# Patient Record
Sex: Female | Born: 1980 | Race: White | Hispanic: No | Marital: Single | Smoking: Never smoker
Health system: Southern US, Community
[De-identification: ages and names within clinical notes are randomized; demographics above are authoritative.]

## PROBLEM LIST (undated history)

## (undated) DIAGNOSIS — J45909 Unspecified asthma, uncomplicated: Secondary | ICD-10-CM

## (undated) DIAGNOSIS — R062 Wheezing: Secondary | ICD-10-CM

## (undated) DIAGNOSIS — K589 Irritable bowel syndrome without diarrhea: Secondary | ICD-10-CM

## (undated) DIAGNOSIS — N83209 Unspecified ovarian cyst, unspecified side: Secondary | ICD-10-CM

## (undated) DIAGNOSIS — G43111 Migraine with aura, intractable, with status migrainosus: Secondary | ICD-10-CM

## (undated) DIAGNOSIS — F99 Mental disorder, not otherwise specified: Secondary | ICD-10-CM

## (undated) DIAGNOSIS — K5792 Diverticulitis of intestine, part unspecified, without perforation or abscess without bleeding: Secondary | ICD-10-CM

## (undated) DIAGNOSIS — K8020 Calculus of gallbladder without cholecystitis without obstruction: Secondary | ICD-10-CM

## (undated) DIAGNOSIS — G44009 Cluster headache syndrome, unspecified, not intractable: Secondary | ICD-10-CM

## (undated) DIAGNOSIS — N390 Urinary tract infection, site not specified: Secondary | ICD-10-CM

## (undated) DIAGNOSIS — R51 Headache: Secondary | ICD-10-CM

## (undated) DIAGNOSIS — R519 Headache, unspecified: Secondary | ICD-10-CM

## (undated) DIAGNOSIS — J3489 Other specified disorders of nose and nasal sinuses: Secondary | ICD-10-CM

## (undated) HISTORY — DX: Unspecified asthma, uncomplicated: J45.909

## (undated) HISTORY — PX: WISDOM TOOTH EXTRACTION: SHX21

## (undated) HISTORY — DX: Irritable bowel syndrome, unspecified: K58.9

## (undated) HISTORY — DX: Mental disorder, not otherwise specified: F99

## (undated) HISTORY — DX: Migraine with aura, intractable, with status migrainosus: G43.111

## (undated) HISTORY — DX: Diverticulitis of intestine, part unspecified, without perforation or abscess without bleeding: K57.92

## (undated) HISTORY — PX: TONSILLECTOMY: SUR1361

---
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
## 2009-12-14 ENCOUNTER — Emergency Department (HOSPITAL_COMMUNITY): Admission: EM | Admit: 2009-12-14 | Discharge: 2009-12-14 | Payer: Self-pay | Admitting: Emergency Medicine

## 2010-08-18 ENCOUNTER — Emergency Department (HOSPITAL_COMMUNITY): Admission: EM | Admit: 2010-08-18 | Discharge: 2010-08-19 | Payer: Self-pay | Admitting: Emergency Medicine

## 2011-02-13 LAB — CBC
HCT: 41.1 % (ref 36.0–46.0)
Hemoglobin: 14 g/dL (ref 12.0–15.0)
MCH: 30.1 pg (ref 26.0–34.0)
MCHC: 34 g/dL (ref 30.0–36.0)
MCV: 88.7 fL (ref 78.0–100.0)
Platelets: 299 K/uL (ref 150–400)
RBC: 4.64 MIL/uL (ref 3.87–5.11)
RDW: 14.4 % (ref 11.5–15.5)
WBC: 12.8 K/uL — ABNORMAL HIGH (ref 4.0–10.5)

## 2011-02-13 LAB — COMPREHENSIVE METABOLIC PANEL WITH GFR
ALT: 30 U/L (ref 0–35)
AST: 27 U/L (ref 0–37)
Albumin: 3.9 g/dL (ref 3.5–5.2)
Alkaline Phosphatase: 76 U/L (ref 39–117)
BUN: 10 mg/dL (ref 6–23)
CO2: 24 meq/L (ref 19–32)
Calcium: 8.7 mg/dL (ref 8.4–10.5)
Chloride: 101 meq/L (ref 96–112)
Creatinine, Ser: 0.7 mg/dL (ref 0.4–1.2)
GFR calc non Af Amer: >60 mL/min
Glucose, Bld: 90 mg/dL (ref 70–99)
Potassium: 3.3 meq/L — ABNORMAL LOW (ref 3.5–5.1)
Sodium: 135 meq/L (ref 135–145)
Total Bilirubin: 0.4 mg/dL (ref 0.3–1.2)
Total Protein: 8.3 g/dL (ref 6.0–8.3)

## 2011-02-13 LAB — URINALYSIS, ROUTINE W REFLEX MICROSCOPIC
Glucose, UA: NEGATIVE mg/dL
Leukocytes, UA: NEGATIVE
Nitrite: NEGATIVE
Protein, ur: NEGATIVE mg/dL
Specific Gravity, Urine: >1.03 — ABNORMAL HIGH (ref 1.005–1.030)
Urobilinogen, UA: 0.2 mg/dL (ref 0.0–1.0)
pH: 6 (ref 5.0–8.0)

## 2011-02-13 LAB — DIFFERENTIAL
Basophils Absolute: 0 K/uL (ref 0.0–0.1)
Basophils Relative: 0 % (ref 0–1)
Eosinophils Absolute: 0 K/uL (ref 0.0–0.7)
Eosinophils Relative: 0 % (ref 0–5)
Lymphocytes Relative: 9 % — ABNORMAL LOW (ref 12–46)
Lymphs Abs: 1.1 K/uL (ref 0.7–4.0)
Monocytes Absolute: 0.7 K/uL (ref 0.1–1.0)
Monocytes Relative: 5 % (ref 3–12)
Neutro Abs: 10.9 K/uL — ABNORMAL HIGH (ref 1.7–7.7)
Neutrophils Relative %: 86 % — ABNORMAL HIGH (ref 43–77)

## 2011-02-13 LAB — URINE MICROSCOPIC-ADD ON

## 2011-02-13 LAB — POCT PREGNANCY, URINE: Preg Test, Ur: NEGATIVE

## 2011-02-13 LAB — LIPASE, BLOOD: Lipase: 33 U/L (ref 11–59)

## 2011-02-16 LAB — PREGNANCY, URINE: Preg Test, Ur: NEGATIVE

## 2011-07-09 ENCOUNTER — Emergency Department (HOSPITAL_COMMUNITY)
Admission: EM | Admit: 2011-07-09 | Discharge: 2011-07-09 | Disposition: A | Discharge status: Home / Self Care | Payer: Self-pay | Attending: Emergency Medicine | Admitting: Emergency Medicine

## 2011-07-09 DIAGNOSIS — H6690 Otitis media, unspecified, unspecified ear: Secondary | ICD-10-CM | POA: Insufficient documentation

## 2011-07-09 HISTORY — DX: Calculus of gallbladder without cholecystitis without obstruction: K80.20

## 2011-07-09 MED ORDER — AMOXICILLIN 500 MG PO CAPS: 500.0000 mg | ORAL_CAPSULE | Freq: Three times a day (TID) | ORAL | Status: AC

## 2011-07-09 NOTE — ED Notes (Signed)
Pt c/o chest congestion along with cough for about 1 month. Pt now c/o rt eye pain and hearing "ringing in ear"

## 2011-07-09 NOTE — ED Provider Notes (Signed)
History   Chart scribed for Stacy Gaskins, MD by Enos Fling; the patient was seen in room APFT24/APFT24; this patient's care was started at 11:38 AM.    CSN: [id number] Arrival date & time: 07/09/2011 11:13 AM  Chief Complaint  Patient presents with  . Otalgia  . Cough   HPI Stacy Swanson is a 30 y.o. female who presents to the Emergency Department complaining of cough, congestion, and ear pain. Pt reports constant cough x 1 month with constant sinus congestion x 1 week. Pt reports h/o sinus congestion d/t allergies. This AM pt woke with moderate right ear pain and "dull hearing" from the right ear. No ear drainage. No fevers, sob, cp, or body aches. No abd pain or n/v/d. Denies recent abx, no h/o abx allergies.  Past Medical History  Diagnosis Date  . Gallstones     Past Surgical History  Procedure Date  . Tonsillectomy     No family history on file.  History  Substance Use Topics  . Smoking status: Never Smoker   . Smokeless tobacco: Not on file  . Alcohol Use: No    OB History    Grav Para Term Preterm Abortions TAB SAB Ect Mult Living                  Review of Systems 10 Systems reviewed and are negative for acute change except as noted in the HPI.  Physical Exam  BP 121/87  Pulse 99  Temp(Src) 97.9 F (36.6 C) (Oral)  Resp 16  Ht 5\' 6"  (1.676 m)  Wt 222 lb (100.699 kg)  BMI 35.83 kg/m2  SpO2 100%  LMP 03/09/2011  Physical Exam CONSTITUTIONAL: Well developed/well nourished HEAD AND FACE: Normocephalic/atraumatic EYES: EOMI/PERRL ENMT: Mucous membranes moist; right TM erythematous; left TM normal; OP normal with no redness NECK: supple no meningeal signs; no lympadenopathy CV: S1/S2 noted, no murmurs/rubs/gallops noted LUNGS: Lungs are clear to auscultation bilaterally, no apparent distress ABDOMEN: soft, nontender, no rebound or guarding NEURO: Pt is awake/alert, moves all extremitiesx4 EXTREMITIES: pulses normal, full ROM SKIN: warm,  color normal PSYCH: no abnormalities of mood noted  ED Course  Procedures  OTHER DATA REVIEWED: Nursing notes and vital signs reviewed.   MDM: Lungs CTA, throat normal; erythema to R TM, c/w otitis media; d/c with amoxicillin.   IMPRESSION: Otitis media  CONDITION ON DISCHARGE: stable  MEDS GIVEN IN ED: none  DISCHARGE MEDICATIONS: New Prescriptions   AMOXICILLIN (AMOXIL) 500 MG CAPSULE    Take 1 capsule (500 mg total) by mouth 3 (three) times daily.     I personally performed the services described in this documentation, which was scribed in my presence. The recorded information has been reviewed and considered. Stacy Gaskins, MD     Stacy Gaskins, MD 07/09/11 [phone]

## 2013-02-22 ENCOUNTER — Emergency Department (HOSPITAL_COMMUNITY)
Admission: EM | Admit: 2013-02-22 | Discharge: 2013-02-22 | Disposition: A | Discharge status: Home / Self Care | Payer: Self-pay | Attending: Emergency Medicine | Admitting: Emergency Medicine

## 2013-02-22 ENCOUNTER — Encounter (HOSPITAL_COMMUNITY): Payer: Self-pay | Admitting: Emergency Medicine

## 2013-02-22 DIAGNOSIS — R112 Nausea with vomiting, unspecified: Secondary | ICD-10-CM | POA: Insufficient documentation

## 2013-02-22 DIAGNOSIS — R0982 Postnasal drip: Secondary | ICD-10-CM | POA: Insufficient documentation

## 2013-02-22 DIAGNOSIS — G43909 Migraine, unspecified, not intractable, without status migrainosus: Secondary | ICD-10-CM | POA: Insufficient documentation

## 2013-02-22 DIAGNOSIS — R350 Frequency of micturition: Secondary | ICD-10-CM | POA: Insufficient documentation

## 2013-02-22 DIAGNOSIS — Z8719 Personal history of other diseases of the digestive system: Secondary | ICD-10-CM | POA: Insufficient documentation

## 2013-02-22 DIAGNOSIS — R300 Dysuria: Secondary | ICD-10-CM | POA: Insufficient documentation

## 2013-02-22 DIAGNOSIS — Z79899 Other long term (current) drug therapy: Secondary | ICD-10-CM | POA: Insufficient documentation

## 2013-02-22 DIAGNOSIS — N390 Urinary tract infection, site not specified: Secondary | ICD-10-CM | POA: Insufficient documentation

## 2013-02-22 DIAGNOSIS — R3915 Urgency of urination: Secondary | ICD-10-CM | POA: Insufficient documentation

## 2013-02-22 LAB — URINALYSIS, ROUTINE W REFLEX MICROSCOPIC
Glucose, UA: NEGATIVE mg/dL
Protein, ur: NEGATIVE mg/dL
Specific Gravity, Urine: 1.025 (ref 1.005–1.030)
pH: 5.5 (ref 5.0–8.0)

## 2013-02-22 LAB — URINE MICROSCOPIC-ADD ON

## 2013-02-22 MED ORDER — DEXAMETHASONE SODIUM PHOSPHATE 4 MG/ML IJ SOLN
10.0000 mg | Freq: Once | INTRAMUSCULAR | Status: AC
  Administered 2013-02-22: 10 mg via INTRAVENOUS
  Filled 2013-02-22: qty 3

## 2013-02-22 MED ORDER — DIPHENHYDRAMINE HCL 50 MG/ML IJ SOLN
25.0000 mg | Freq: Once | INTRAMUSCULAR | Status: AC
  Administered 2013-02-22: 25 mg via INTRAVENOUS
  Filled 2013-02-22: qty 1

## 2013-02-22 MED ORDER — SODIUM CHLORIDE 0.9 % IV SOLN
1000.0000 mL | INTRAVENOUS | Status: DC
  Administered 2013-02-22: 1000 mL via INTRAVENOUS

## 2013-02-22 MED ORDER — METOCLOPRAMIDE HCL 10 MG PO TABS: 10.0000 mg | ORAL_TABLET | Freq: Four times a day (QID) | ORAL | Status: DC | PRN

## 2013-02-22 MED ORDER — SODIUM CHLORIDE 0.9 % IV SOLN
1000.0000 mL | Freq: Once | INTRAVENOUS | Status: AC
  Administered 2013-02-22: 1000 mL via INTRAVENOUS

## 2013-02-22 MED ORDER — METOCLOPRAMIDE HCL 5 MG/ML IJ SOLN
10.0000 mg | Freq: Once | INTRAMUSCULAR | Status: AC
  Administered 2013-02-22: 10 mg via INTRAVENOUS
  Filled 2013-02-22: qty 2

## 2013-02-22 MED ORDER — ONDANSETRON 8 MG PO TBDP
8.0000 mg | ORAL_TABLET | Freq: Once | ORAL | Status: AC
  Administered 2013-02-22: 8 mg via ORAL
  Filled 2013-02-22: qty 1

## 2013-02-22 MED ORDER — CEPHALEXIN 500 MG PO CAPS
1000.0000 mg | ORAL_CAPSULE | Freq: Once | ORAL | Status: AC
  Administered 2013-02-22: 1000 mg via ORAL
  Filled 2013-02-22: qty 2

## 2013-02-22 MED ORDER — CEPHALEXIN 500 MG PO CAPS: 500.0000 mg | ORAL_CAPSULE | Freq: Four times a day (QID) | ORAL | Status: DC

## 2013-02-22 NOTE — ED Provider Notes (Signed)
History  This chart was scribed for Stacy Booze, MD by Bennett Scrape, ED Scribe. This patient was seen in room APFT22/APFT22 and the patient's care was started at 10:46 AM.  CSN: [id number]  Arrival date & time 02/22/13  1012   First MD Initiated Contact with Patient 02/22/13 1046      Chief Complaint  Patient presents with  . Urinary Tract Infection  . Facial Pain  . Headache    The history is provided by the patient. No language interpreter was used.    Stacy Swanson is a 32 y.o. female who presents to the Emergency Department complaining of 3 days of dysuria described as burning with associated suprapubic spasms, urinary urgency and frequency. The symptoms are worse at night. She rates her pain a 3 out of 10 currently and 5 or 6 out of 10 at its worst. She reports taking an AZO pill last night with mild improvement. She also c/o 2 days of bilateral periorbital HA described as pressure that radiates to the neck with associated postnasal drip, nausea and one episode of emesis. She rates her pain a 7 or 8 out of 10 currently. The pain is worse with bright light and loud noises. She denies visual disturbance, sore throat, fever and chills as associated symptoms. She denies chance of pregnancy stating that she is on birth control currently. She has a h/o gallstones and denies smoking and alcohol use.   Past Medical History  Diagnosis Date  . Gallstones     Past Surgical History  Procedure Laterality Date  . Tonsillectomy      No family history on file.  History  Substance Use Topics  . Smoking status: Never Smoker   . Smokeless tobacco: Not on file  . Alcohol Use: No    No OB history provided.  Review of Systems  Constitutional: Negative for fever and chills.  HENT: Positive for postnasal drip. Negative for sore throat.   Eyes: Negative for visual disturbance.  Respiratory: Negative for cough.   Gastrointestinal: Positive for nausea, vomiting and abdominal pain.  Negative for diarrhea.  Neurological: Positive for headaches.  All other systems reviewed and are negative.    Allergies  Codeine and Lactose intolerance (gi)  Home Medications   Current Outpatient Rx  Name  Route  Sig  Dispense  Refill  . BIOTIN PO   Oral   Take 1 tablet by mouth daily.         Marland Kitchen guaiFENesin (MUCINEX) 600 MG 12 hr tablet   Oral   Take 1,200 mg by mouth 2 (two) times daily.         Marland Kitchen ibuprofen (ADVIL,MOTRIN) 200 MG tablet   Oral   Take 400 mg by mouth every 6 (six) hours as needed for pain.         . IRON PO   Oral   Take 1 tablet by mouth every other day.         . Multiple Vitamin (MULTIVITAMIN WITH MINERALS) TABS   Oral   Take 1 tablet by mouth daily.         Marland Kitchen PRESCRIPTION MEDICATION   Oral   Take 1 tablet by mouth daily. Birth Control         . Probiotic Product (PROBIOTIC DAILY PO)   Oral   Take 1 tablet by mouth daily.         . pseudoephedrine-acetaminophen (TYLENOL SINUS) 30-500 MG TABS   Oral   Take 1 tablet  by mouth every 4 (four) hours as needed (Headache).           Triage Vitals: BP 105/75  Pulse 88  Temp(Src) 98.1 F (36.7 C)  Resp 18  Ht 5' 6.5" (1.689 m)  Wt 207 lb (93.895 kg)  BMI 32.91 kg/m2  SpO2 99%  LMP 02/22/2013  Physical Exam  Nursing note and vitals reviewed. Constitutional: She is oriented to person, place, and time. She appears well-developed and well-nourished. No distress.  Appears uncomfortable  HENT:  Head: Normocephalic and atraumatic.  Normal nasal mucosa, mild tenderness of frontal and maxillary sinuses  Eyes: Conjunctivae and EOM are normal. Pupils are equal, round, and reactive to light.  Fundi are normal  Neck: Neck supple. No tracheal deviation present.  Cardiovascular: Normal rate, regular rhythm and normal heart sounds.   Pulmonary/Chest: Effort normal and breath sounds normal. No respiratory distress.  Abdominal: Soft. There is no tenderness.  Bowel sounds are decreased   Musculoskeletal: Normal range of motion. She exhibits no edema.  Neurological: She is alert and oriented to person, place, and time.  Skin: Skin is warm and dry.  Psychiatric: She has a normal mood and affect. Her behavior is normal.    ED Course  Procedures (including critical care time)  DIAGNOSTIC STUDIES: Oxygen Saturation is 99% on room air, normal by my interpretation.    COORDINATION OF CARE: 10:52 AM-Discussed treatment plan which includes migraine cocktail and UA with pt at bedside and pt agreed to plan.   11:00 AM-Ordered 10 mg Reglan injection and 25 mg Benadryl injection  Results for orders placed during the hospital encounter of 02/22/13  URINALYSIS, ROUTINE W REFLEX MICROSCOPIC      Result Value Range   Color, Urine YELLOW  YELLOW   APPearance CLEAR  CLEAR   Specific Gravity, Urine 1.025  1.005 - 1.030   pH 5.5  5.0 - 8.0   Glucose, UA NEGATIVE  NEGATIVE mg/dL   Hgb urine dipstick MODERATE (*) NEGATIVE   Bilirubin Urine NEGATIVE  NEGATIVE   Ketones, ur NEGATIVE  NEGATIVE mg/dL   Protein, ur NEGATIVE  NEGATIVE mg/dL   Urobilinogen, UA 0.2  0.0 - 1.0 mg/dL   Nitrite POSITIVE (*) NEGATIVE   Leukocytes, UA SMALL (*) NEGATIVE  PREGNANCY, URINE      Result Value Range   Preg Test, Ur NEGATIVE  NEGATIVE  URINE MICROSCOPIC-ADD ON      Result Value Range   WBC, UA TOO NUMEROUS TO COUNT  <3 WBC/hpf   RBC / HPF TOO NUMEROUS TO COUNT  <3 RBC/hpf   Bacteria, UA MANY (*) RARE     1. Urinary tract infection   2. Migraine headache       MDM  Probable urinary tract infection. Migraine headache. Urinalysis is been obtained. Migraine will be treated with IV fluids, IV metoclopramide, and IV diphenhydramine.  Urinalysis confirms presence of urinary tract infection. She will be treated with cephalexin. Headache is much better after above noted treatment. She is sent home with prescription for metoclopramide. She's given a dose of dexamethasone prior to discharge.   I  personally performed the services described in this documentation, which was scribed in my presence. The recorded information has been reviewed and is accurate.        Stacy Booze, MD 02/22/13 [phone]

## 2013-02-22 NOTE — ED Notes (Addendum)
Uti sx since Saturday and facial pain/"sinus ha" since Sunday. Some nausea.

## 2013-02-23 LAB — URINE CULTURE

## 2013-02-24 ENCOUNTER — Telehealth (HOSPITAL_COMMUNITY): Payer: Self-pay | Admitting: Emergency Medicine

## 2013-02-24 NOTE — ED Notes (Signed)
Positive urnc- treated per protocol. No further follow up at this time.  

## 2015-01-14 ENCOUNTER — Encounter (HOSPITAL_COMMUNITY): Payer: Self-pay | Admitting: Emergency Medicine

## 2015-01-14 ENCOUNTER — Emergency Department (HOSPITAL_COMMUNITY): Payer: 59

## 2015-01-14 ENCOUNTER — Emergency Department (HOSPITAL_COMMUNITY)
Admission: EM | Admit: 2015-01-14 | Discharge: 2015-01-14 | Disposition: A | Discharge status: Home / Self Care | Payer: 59 | Attending: Emergency Medicine | Admitting: Emergency Medicine

## 2015-01-14 DIAGNOSIS — R630 Anorexia: Secondary | ICD-10-CM | POA: Diagnosis not present

## 2015-01-14 DIAGNOSIS — Z3202 Encounter for pregnancy test, result negative: Secondary | ICD-10-CM | POA: Insufficient documentation

## 2015-01-14 DIAGNOSIS — R110 Nausea: Secondary | ICD-10-CM | POA: Diagnosis not present

## 2015-01-14 DIAGNOSIS — R001 Bradycardia, unspecified: Secondary | ICD-10-CM | POA: Insufficient documentation

## 2015-01-14 DIAGNOSIS — Z79899 Other long term (current) drug therapy: Secondary | ICD-10-CM | POA: Insufficient documentation

## 2015-01-14 DIAGNOSIS — K5900 Constipation, unspecified: Secondary | ICD-10-CM

## 2015-01-14 DIAGNOSIS — R1031 Right lower quadrant pain: Secondary | ICD-10-CM | POA: Diagnosis present

## 2015-01-14 DIAGNOSIS — R1030 Lower abdominal pain, unspecified: Secondary | ICD-10-CM

## 2015-01-14 LAB — COMPREHENSIVE METABOLIC PANEL
ALT: 13 U/L (ref 0–35)
ANION GAP: 4 — AB (ref 5–15)
AST: 16 U/L (ref 0–37)
Albumin: 4.3 g/dL (ref 3.5–5.2)
Alkaline Phosphatase: 38 U/L — ABNORMAL LOW (ref 39–117)
BILIRUBIN TOTAL: 0.4 mg/dL (ref 0.3–1.2)
BUN: 10 mg/dL (ref 6–23)
CHLORIDE: 109 mmol/L (ref 96–112)
CO2: 25 mmol/L (ref 19–32)
Calcium: 8.4 mg/dL (ref 8.4–10.5)
Creatinine, Ser: 0.78 mg/dL (ref 0.50–1.10)
GFR calc Af Amer: >90 mL/min (ref >90)
GFR calc non Af Amer: >90 mL/min (ref >90)
Glucose, Bld: 97 mg/dL (ref 70–99)
Potassium: 3.3 mmol/L — ABNORMAL LOW (ref 3.5–5.1)
Sodium: 138 mmol/L (ref 135–145)
Total Protein: 7.5 g/dL (ref 6.0–8.3)

## 2015-01-14 LAB — CBC WITH DIFFERENTIAL/PLATELET
BASOS ABS: 0 10*3/uL (ref 0.0–0.1)
Basophils Relative: 0 % (ref 0–1)
Eosinophils Absolute: 0.1 10*3/uL (ref 0.0–0.7)
Eosinophils Relative: 1 % (ref 0–5)
HEMATOCRIT: 39 % (ref 36.0–46.0)
HEMOGLOBIN: 12.8 g/dL (ref 12.0–15.0)
LYMPHS PCT: 21 % (ref 12–46)
Lymphs Abs: 1.6 10*3/uL (ref 0.7–4.0)
MCH: 30.5 pg (ref 26.0–34.0)
MCHC: 32.8 g/dL (ref 30.0–36.0)
MCV: 92.9 fL (ref 78.0–100.0)
MONOS PCT: 12 % (ref 3–12)
Monocytes Absolute: 0.9 10*3/uL (ref 0.1–1.0)
NEUTROS PCT: 66 % (ref 43–77)
Neutro Abs: 5 10*3/uL (ref 1.7–7.7)
Platelets: 296 10*3/uL (ref 150–400)
RBC: 4.2 MIL/uL (ref 3.87–5.11)
RDW: 13.5 % (ref 11.5–15.5)
WBC: 7.6 10*3/uL (ref 4.0–10.5)

## 2015-01-14 LAB — URINALYSIS, ROUTINE W REFLEX MICROSCOPIC
Bilirubin Urine: NEGATIVE
GLUCOSE, UA: NEGATIVE mg/dL
Leukocytes, UA: NEGATIVE
Nitrite: NEGATIVE
PH: 6 (ref 5.0–8.0)
Protein, ur: NEGATIVE mg/dL
Specific Gravity, Urine: 1.02 (ref 1.005–1.030)
Urobilinogen, UA: 0.2 mg/dL (ref 0.0–1.0)

## 2015-01-14 LAB — URINE MICROSCOPIC-ADD ON

## 2015-01-14 LAB — POC OCCULT BLOOD, ED: FECAL OCCULT BLD: POSITIVE — AB

## 2015-01-14 LAB — POC URINE PREG, ED: Preg Test, Ur: NEGATIVE

## 2015-01-14 MED ORDER — MORPHINE SULFATE 4 MG/ML IJ SOLN
4.0000 mg | Freq: Once | INTRAMUSCULAR | Status: AC
  Administered 2015-01-14: 4 mg via INTRAVENOUS
  Filled 2015-01-14: qty 1

## 2015-01-14 MED ORDER — SODIUM CHLORIDE 0.9 % IV BOLUS (SEPSIS)
1000.0000 mL | Freq: Once | INTRAVENOUS | Status: AC
  Administered 2015-01-14: 1000 mL via INTRAVENOUS

## 2015-01-14 MED ORDER — HYDROMORPHONE HCL 1 MG/ML IJ SOLN
0.5000 mg | Freq: Once | INTRAMUSCULAR | Status: AC
  Administered 2015-01-14: 0.5 mg via INTRAVENOUS
  Filled 2015-01-14: qty 1

## 2015-01-14 MED ORDER — ONDANSETRON HCL 4 MG/2ML IJ SOLN
4.0000 mg | Freq: Once | INTRAMUSCULAR | Status: AC
  Administered 2015-01-14: 4 mg via INTRAVENOUS
  Filled 2015-01-14: qty 2

## 2015-01-14 MED ORDER — NEOMYCIN-POLYMYXIN-HC 3.5-10000-1 OT SUSP
4.0000 [drp] | Freq: Four times a day (QID) | OTIC | Status: DC
  Filled 2015-01-14: qty 10

## 2015-01-14 MED ORDER — IOHEXOL 300 MG/ML  SOLN
100.0000 mL | Freq: Once | INTRAMUSCULAR | Status: AC | PRN
  Administered 2015-01-14: 100 mL via INTRAVENOUS

## 2015-01-14 MED ORDER — KETOROLAC TROMETHAMINE 60 MG/2ML IM SOLN: 60.0000 mg | Freq: Once | INTRAMUSCULAR | Status: DC

## 2015-01-14 MED ORDER — POLYETHYLENE GLYCOL 3350 17 G PO PACK: 17.0000 g | PACK | Freq: Every day | ORAL | Status: DC

## 2015-01-14 MED ORDER — KETOROLAC TROMETHAMINE 30 MG/ML IJ SOLN
30.0000 mg | Freq: Once | INTRAMUSCULAR | Status: AC
  Administered 2015-01-14: 30 mg via INTRAVENOUS
  Filled 2015-01-14: qty 1

## 2015-01-14 MED ORDER — ONDANSETRON HCL 4 MG/2ML IJ SOLN
4.0000 mg | Freq: Once | INTRAMUSCULAR | Status: AC
  Administered 2015-01-14: 4 mg via INTRAVENOUS

## 2015-01-14 MED ORDER — PROMETHAZINE HCL 12.5 MG PO TABS: 12.5000 mg | ORAL_TABLET | Freq: Four times a day (QID) | ORAL | Status: DC | PRN

## 2015-01-14 MED ORDER — ONDANSETRON HCL 4 MG/2ML IJ SOLN
INTRAMUSCULAR | Status: AC
  Administered 2015-01-14: 4 mg via INTRAVENOUS
  Filled 2015-01-14: qty 2

## 2015-01-14 MED ORDER — IOHEXOL 300 MG/ML  SOLN
25.0000 mL | Freq: Once | INTRAMUSCULAR | Status: AC | PRN
  Administered 2015-01-14: 25 mL via ORAL

## 2015-01-14 NOTE — ED Notes (Signed)
[facility] Dr. Adriana Simasook will reevaluate patient after liter of fluid is finished.

## 2015-01-14 NOTE — ED Notes (Signed)
Pt reports periumbilical pain and nausea since this am. Pt reports "passed out" twice this am when pain intensified. Pt reports similar events in the past. Pt alert and oriented. Pt ambulated to room with steady gait. nad noted.

## 2015-01-14 NOTE — Discharge Instructions (Signed)
°  Your blood work, urine and CT scan today were all normal. You did have blood in your stool. This may have been from irritation from being constipated but it does need to be followed up. Make an appointment with your doctor for follow up. Return here as needed for worsening symptoms.

## 2015-01-14 NOTE — ED Provider Notes (Signed)
CSN: [id number]     Arrival date & time 01/14/15  0711 History   First MD Initiated Contact with Patient 01/14/15 [phone]     Chief Complaint  Patient presents with  . Abdominal Pain     (Consider location/radiation/quality/duration/timing/severity/associated sxs/prior Treatment) Patient is a 34 y.o. female presenting with abdominal pain. The history is provided by the patient.  Abdominal Pain Pain location:  Periumbilical Pain quality: aching and sharp   Pain radiates to:  RLQ Pain severity:  Severe Onset quality:  Gradual Duration:  3 hours Timing:  Constant Relieved by:  Nothing Worsened by:  Position changes Ineffective treatments:  None tried Associated symptoms: anorexia and nausea   Stacy Swanson is a 34 y.o. female who presents to the ED with abdominal pain. The pain started this morning. She reports nausea and syncopal episode x 2. She has had a similar episode in the past. Patient's last menstrual period was 01/14/2015. She states that the pain is constant and at its worst is 9/10 and then goes down to 3/10. Patient with hx of multiple cyst that in total measured 24 cm and required surgery by an Oncology/GYN. She also has severe constipation and used a stool softener every day and eats food high in fiber along with fiber bars. She reports her last BM being at least 2 days ago.   Past Medical History  Diagnosis Date  . Gallstones    Past Surgical History  Procedure Laterality Date  . Tonsillectomy     History reviewed. No pertinent family history. History  Substance Use Topics  . Smoking status: Never Smoker   . Smokeless tobacco: Not on file  . Alcohol Use: Yes     Comment: occasional    OB History    No data available     Review of Systems  Gastrointestinal: Positive for nausea, abdominal pain and anorexia.  all other systems negative    Allergies  Codeine and Lactose intolerance (gi)  Home Medications   Prior to Admission medications    Medication Sig Start Date End Date Taking? Authorizing Provider  acetaminophen (TYLENOL) 500 MG tablet Take 500-1,000 mg by mouth every 6 (six) hours as needed for moderate pain.   Yes Historical Provider, MD  CRANBERRY PO Take 1 tablet by mouth daily.   Yes Historical Provider, MD  GuaiFENesin (COUGH SYRUP PO) Take 10 mLs by mouth 2 (two) times daily as needed (cough).   Yes Historical Provider, MD  guaiFENesin (MUCINEX) 600 MG 12 hr tablet Take 1,200 mg by mouth 2 (two) times daily.   Yes Historical Provider, MD  ibuprofen (ADVIL,MOTRIN) 200 MG tablet Take 400 mg by mouth every 6 (six) hours as needed for pain.   Yes Historical Provider, MD  L-LYSINE PO Take 1 tablet by mouth daily.   Yes Historical Provider, MD  naproxen sodium (ANAPROX) 220 MG tablet Take 220-440 mg by mouth daily as needed (cramping).   Yes Historical Provider, MD  norethindrone (MICRONOR,CAMILA,ERRIN) 0.35 MG tablet Take 1 tablet by mouth daily.   Yes Historical Provider, MD  pseudoephedrine-acetaminophen (TYLENOL SINUS) 30-500 MG TABS Take 1 tablet by mouth every 4 (four) hours as needed (Headache).   Yes Historical Provider, MD  cephALEXin (KEFLEX) 500 MG capsule Take 1 capsule (500 mg total) by mouth 4 (four) times daily. Patient not taking: Reported on 01/14/2015 02/22/13   Dione Booze, MD  metoCLOPramide (REGLAN) 10 MG tablet Take 1 tablet (10 mg total) by mouth every 6 (six) hours as  needed (Headache or nausea). Patient not taking: Reported on 01/14/2015 02/22/13   Dione Boozeavid Glick, MD  polyethylene glycol [facility](MIRALAX) packet Take 17 g by mouth daily. 01/14/15   Hope Orlene OchM Neese, NP  promethazine (PHENERGAN) 12.5 MG tablet Take 1 tablet (12.5 mg total) by mouth every 6 (six) hours as needed for nausea or vomiting. 01/14/15   Hope Orlene OchM Neese, NP   BP 107/69 mmHg  Pulse 51  Temp(Src) 98.1 F (36.7 C) (Oral)  Resp 13  Ht 5\' 6"  (1.676 m)  Wt 175 lb (79.379 kg)  BMI 28.26 kg/m2  SpO2 100%  LMP 01/14/2015 Physical Exam   Constitutional: She is oriented to person, place, and time. She appears well-developed and well-nourished. No distress.  Appears uncomfortable  HENT:  Head: Normocephalic.  Eyes: Conjunctivae and EOM are normal.  Neck: Normal range of motion. Neck supple.  Cardiovascular: Regular rhythm.  Bradycardia present.   Pulmonary/Chest: Effort normal and breath sounds normal.  Abdominal: Soft. Bowel sounds are normal. There is tenderness in the right lower quadrant and periumbilical area.  Genitourinary: Rectal exam shows no external hemorrhoid, no mass, no tenderness and anal tone normal. Guaiac positive stool.  Musculoskeletal: Normal range of motion.  Neurological: She is alert and oriented to person, place, and time. No cranial nerve deficit.  Skin: Skin is warm and dry.  Psychiatric: She has a normal mood and affect. Her behavior is normal.  Nursing note and vitals reviewed.   ED Course  Procedures (including critical care time) Labs Review Results for orders placed or performed during the hospital encounter of 01/14/15 (from the past 24 hour(s))  CBC with Differential     Status: None   Collection Time: 01/14/15  7:22 AM  Result Value Ref Range   WBC 7.6 4.0 - 10.5 K/uL   RBC 4.20 3.87 - 5.11 MIL/uL   Hemoglobin 12.8 12.0 - 15.0 g/dL   HCT 69.639.0 29.536.0 - 28.446.0 %   MCV 92.9 78.0 - 100.0 fL   MCH 30.5 26.0 - 34.0 pg   MCHC 32.8 30.0 - 36.0 g/dL   RDW 13.213.5 44.011.5 - 10.215.5 %   Platelets 296 150 - 400 K/uL   Neutrophils Relative % 66 43 - 77 %   Neutro Abs 5.0 1.7 - 7.7 K/uL   Lymphocytes Relative 21 12 - 46 %   Lymphs Abs 1.6 0.7 - 4.0 K/uL   Monocytes Relative 12 3 - 12 %   Monocytes Absolute 0.9 0.1 - 1.0 K/uL   Eosinophils Relative 1 0 - 5 %   Eosinophils Absolute 0.1 0.0 - 0.7 K/uL   Basophils Relative 0 0 - 1 %   Basophils Absolute 0.0 0.0 - 0.1 K/uL  Comprehensive metabolic panel     Status: Abnormal   Collection Time: 01/14/15  7:22 AM  Result Value Ref Range   Sodium 138 135  - 145 mmol/L   Potassium 3.3 (L) 3.5 - 5.1 mmol/L   Chloride 109 96 - 112 mmol/L   CO2 25 19 - 32 mmol/L   Glucose, Bld 97 70 - 99 mg/dL   BUN 10 6 - 23 mg/dL   Creatinine, Ser 7.250.78 0.50 - 1.10 mg/dL   Calcium 8.4 8.4 - 36.610.5 mg/dL   Total Protein 7.5 6.0 - 8.3 g/dL   Albumin 4.3 3.5 - 5.2 g/dL   AST 16 0 - 37 U/L   ALT 13 0 - 35 U/L   Alkaline Phosphatase 38 (L) 39 - 117 U/L  Total Bilirubin 0.4 0.3 - 1.2 mg/dL   GFR calc non Af Amer >90 >90 mL/min   GFR calc Af Amer >90 >90 mL/min   Anion gap 4 (L) 5 - 15  Urinalysis, Routine w reflex microscopic     Status: Abnormal   Collection Time: 01/14/15  9:20 AM  Result Value Ref Range   Color, Urine YELLOW YELLOW   APPearance HAZY (A) CLEAR   Specific Gravity, Urine 1.020 1.005 - 1.030   pH 6.0 5.0 - 8.0   Glucose, UA NEGATIVE NEGATIVE mg/dL   Hgb urine dipstick TRACE (A) NEGATIVE   Bilirubin Urine NEGATIVE NEGATIVE   Ketones, ur TRACE (A) NEGATIVE mg/dL   Protein, ur NEGATIVE NEGATIVE mg/dL   Urobilinogen, UA 0.2 0.0 - 1.0 mg/dL   Nitrite NEGATIVE NEGATIVE   Leukocytes, UA NEGATIVE NEGATIVE  Urine microscopic-add on     Status: None   Collection Time: 01/14/15  9:20 AM  Result Value Ref Range   Squamous Epithelial / LPF RARE RARE   WBC, UA 0-2 <3 WBC/hpf   RBC / HPF 0-2 <3 RBC/hpf  POC urine preg, ED (not at [facility])     Status: None   Collection Time: 01/14/15  9:30 AM  Result Value Ref Range   Preg Test, Ur NEGATIVE NEGATIVE  POC occult blood, ED Provider will collect     Status: Abnormal   Collection Time: 01/14/15 10:41 AM  Result Value Ref Range   Fecal Occult Bld POSITIVE (A) NEGATIVE     Imaging Review Ct Abdomen Pelvis W Contrast  01/14/2015   CLINICAL DATA:  Periumbilical abdominal pain. Nausea. Cholelithiasis.  EXAM: CT ABDOMEN AND PELVIS WITH CONTRAST  TECHNIQUE: Multidetector CT imaging of the abdomen and pelvis was performed using the standard protocol following bolus administration of intravenous contrast.   CONTRAST:  OMNIPAQUE IOHEXOL 300 MG/ML  SOLN  COMPARISON:  None.  FINDINGS: Lower Chest:  Unremarkable.  Hepatobiliary: No masses or other significant abnormality identified. Numerous calcified gallstones are seen however there is no evidence of acute cholecystitis or biliary dilatation.  Pancreas: No mass, inflammatory changes, or other significant abnormality identified.  Spleen:  Within normal limits in size and appearance.  Adrenals:  No masses identified.  Kidneys/Urinary Tract:  No evidence of masses or hydronephrosis.  Stomach/Bowel/Peritoneum: No evidence of wall thickening, mass, or obstruction. Large amount of stool is seen throughout colon.  Vascular/Lymphatic: No pathologically enlarged lymph nodes identified. No other significant abnormality visualized.  Reproductive:  No mass or other significant abnormality identified.  Other:  None.  Musculoskeletal:  No suspicious bone lesions identified.  IMPRESSION: Cholelithiasis. No radiographic evidence of cholecystitis or other acute findings.  Large stool burden noted; suggest clinical correlation for possible constipation.   Electronically Signed   By: Myles Rosenthal M.D.   On: 01/14/2015 10:21     EKG Interpretation   Date/Time:  Sunday January 14 2015 07:29:47 EST Ventricular Rate:  53 PR Interval:  148 QRS Duration: 101 QT Interval:  426 QTC Calculation: 400 R Axis:   93 Text Interpretation:  Sinus rhythm Borderline right axis deviation  Confirmed by Adriana Simas  MD, BRIAN ([id number]) on 01/14/2015 8:06:11 AM     Dr. Adriana Simas in to examine the patient. She is feeling better.  MDM  34 y.o. female with abdominal pain that started earlier today and syncopal episode. She had a severe problem with constipation and has had extensive abdominal surgery. CT scan reviewed. I have reviewed this patient's vital signs,  nurses notes, appropriate labs and imaging.  I have discussed findings and plan of care with the patient and she voices understanding and agrees  with plan. Patient to follow up with her PCP for the blood in the stool.  Stable for d/c without dizziness. She does continue to have some abdominal pain. Instructions to the patient to return for worsening symptoms, persistent vomiting, high fevers or other problems.  This patient was evaluated and treated by me and by Dr. Adriana Simas in a shared visit.   Final diagnoses:  Lower abdominal pain  Constipation, unspecified constipation type       [facility], NP 01/14/15 1711  Donnetta Hutching, MD 01/16/15 2151

## 2015-01-14 NOTE — ED Notes (Signed)
Discharge papers available, in room with Kerrie BuffaloHope Neese, NP patient c/o of abdominal pain. [facility] informed Dr. Adriana Simasook, orders received, he will reassess patient.

## 2015-05-29 ENCOUNTER — Other Ambulatory Visit (HOSPITAL_COMMUNITY): Payer: Self-pay | Admitting: Internal Medicine

## 2015-05-29 ENCOUNTER — Ambulatory Visit (HOSPITAL_COMMUNITY)
Admission: RE | Admit: 2015-05-29 | Discharge: 2015-05-29 | Disposition: A | Discharge status: Home / Self Care | Payer: 59 | Source: Ambulatory Visit | Attending: Internal Medicine | Admitting: Internal Medicine

## 2015-05-29 DIAGNOSIS — R1011 Right upper quadrant pain: Secondary | ICD-10-CM | POA: Diagnosis present

## 2015-05-29 DIAGNOSIS — K8020 Calculus of gallbladder without cholecystitis without obstruction: Secondary | ICD-10-CM | POA: Insufficient documentation

## 2015-06-22 NOTE — H&P (Signed)
  NTS SOAP Note  Vital Signs:  Vitals as of: 06/21/2015: Systolic 133: Diastolic 86: Heart Rate 76: Temp 98.76F: Height 39ft 6in: Weight 176Lbs 0 Ounces: BMI 28.41  BMI : 28.41 kg/m2  Subjective: This 34 year old female presents for of right upper quadrant abdominal pain, nausea, and fatty food intolerance over the past few months.  No fever, chills, jaundice.  U/S of gallbladder reveals cholelithiasis, normal common bile duct.  Review of Symptoms:  Constitutional:unremarkable   headache Eyes:unremarkable   sinus problems Cardiovascular:  unremarkable heat induced SOB Gastrointestinabdominal pain, nausea, vomiting, heartburn, dyspepsia Genitourinary:unremarkable   neck and back discomfort Skin:unremarkable Hematolgic/Lymphatic:unremarkable   Allergic/Immunologic:unremarkable   Past Medical History:  Reviewed  Past Medical History  Surgical History: Oophorectomy for ovarian tumor, t and a Medical Problems: none Allergies: codeine Medications: BCP, reflux meds, zofran   Social History:Reviewed  Social History  Preferred Language: English Race:  White Ethnicity: Not Hispanic / Latino Age: 34 year Marital Status:  S Alcohol: socially   Smoking Status: Never smoker reviewed on 06/21/2015 Functional Status reviewed on 06/21/2015 ------------------------------------------------ Bathing: Normal Cooking: Normal Dressing: Normal Driving: Normal Eating: Normal Managing Meds: Normal Oral Care: Normal Shopping: Normal Toileting: Normal Transferring: Normal Walking: Normal Cognitive Status reviewed on 06/21/2015 ------------------------------------------------ Attention: Normal Decision Making: Normal Language: Normal Memory: Normal Motor: Normal Perception: Normal Problem Solving: Normal Visual and Spatial: Normal   Family History:Reviewed  Family Health History Family History is Unknown    Objective Information: General:Well appearing,  well nourished in no distress. Heart:RRR, no murmur Lungs:  CTA bilaterally, no wheezes, rhonchi, rales.  Breathing unlabored. Abdomen:Soft, slight discomfort in right upper quadrant to palpation, large midline surgical scar present on whole abdomen, ND, no HSM, no masses.  Assessment:biliary colic, cholelithiasis  Diagnoses: 574.20  K80.20 Gallstone (Calculus of gallbladder without cholecystitis without obstruction)  Procedures: 13244 - OFFICE OUTPATIENT NEW 30 MINUTES    Plan:  Due to previous surgery, will have to do open cholecystectomy.  Scheduled for 07/11/15.   Patient Education:Alternative treatments to surgery were discussed with patient (and family).  Risks and benefits  of procedure including bleeding, infection, and hepatobiliary injury were fully explained to the patient (and family) who gave informed consent. Patient/family questions were addressed.  Follow-up:Pending Surgery

## 2015-07-04 NOTE — Patient Instructions (Signed)
Stacy Swanson  07/04/2015     @PREFPERIOPPHARMACY @   Your procedure is scheduled on 07/11/2015  Report to [facility] at  1120  A.M.  Call this number if you have problems the morning of surgery:  [phone]   Remember:  Do not eat food or drink liquids after midnight.  Take these medicines the morning of surgery with A SIP OF WATER  Zyrtec, prilosec, zofran.   Do not wear jewelry, make-up or nail polish.  Do not wear lotions, powders, or perfumes.  Do not shave 48 hours prior to surgery.  Men may shave face and neck.  Do not bring valuables to the hospital.  [facility] is not responsible for any belongings or valuables.  Contacts, dentures or bridgework may not be worn into surgery.  Leave your suitcase in the car.  After surgery it may be brought to your room.  For patients admitted to the hospital, discharge time will be determined by your treatment team.  Patients discharged the day of surgery will not be allowed to drive home.   Name and phone number of your driver:   family Special instructions:  none  Please read over the following fact sheets that you were given. Pain Booklet, Coughing and Deep Breathing, Surgical Site Infection Prevention, Anesthesia Post-op Instructions and Care and Recovery After Surgery      Open Cholecystectomy Open cholecystectomy is surgery to remove the gallbladder. The gallbladder is located in the upper right part of the abdomen, behind the liver. It is a storage sac for the bile produced in the liver. Bile aids in the digestion and absorption of fats. Cholecystectomy is often done for inflammation of the gallbladder (cholecystitis). This condition is usually caused by a buildup of gallstones (cholelithiasis) in your gallbladder. Gallstones can block the flow of bile, resulting in inflammation, pain, and possibly infection. In severe cases, emergency surgery may be required. When emergency surgery is not required, you will have  time to prepare for the procedure. LET [facility] CARE PROVIDER KNOW ABOUT:  Any allergies you have.  All medicines you are taking, including vitamins, herbs, eye drops, creams, and over-the-counter medicines.  Previous problems you or members of your family have had with the use of anesthetics.  Any blood disorders you have.  Previous surgeries you have had.  Medical conditions you have. RISKS AND COMPLICATIONS Generally, this is a safe procedure. However, as with any procedure, complications can occur. Possible complications include:  Infection.  Damage to thecommon bile duct, nerves, arteries, veins, or other internal organs such as the stomach or intestines.  Bleeding after surgery.  A stone may remain in the common bile duct. BEFORE THE PROCEDURE  Ask your health care provider about changing or stopping any regular medicines. You will need to stop taking aspirin or blood thinners at least 5 days prior to surgery.  Do not eat or drink anything after midnight the night before surgery.  Let your health care provider know if you develop a cold or other infectious problem before surgery. PROCEDURE   You will be given medicine that makes you sleep through the procedure (general anesthetic).  When you are asleep, your surgeon will make a cut (incision) in the upper abdomen to access your gallbladder.  The surgeon will examine the inside of your abdomen and make sure there are no other problems. Your gallbladder will then be removed.  An X-ray exam of your common bile duct may be  done to look for stones that may have fallen into this duct.  After the removal of your gallbladder, your abdomen will be closed with stitches (sutures) or staples. AFTER THE PROCEDURE  You will be taken to a recovery area where your progress will be checked often.  You will likely need to stay in the hospital for 3-5 days. Document Released: 08/09/2002 Document Revised: 09/07/2013 Document  Reviewed: 06/29/2013 [facility] Patient Information 2015 [place], [place]. This information is not intended to replace advice given to you by your health care provider. Make sure you discuss any questions you have with your health care provider. PATIENT INSTRUCTIONS POST-ANESTHESIA  IMMEDIATELY FOLLOWING SURGERY:  Do not drive or operate machinery for the first twenty four hours after surgery.  Do not make any important decisions for twenty four hours after surgery or while taking narcotic pain medications or sedatives.  If you develop intractable nausea and vomiting or a severe headache please notify your doctor immediately.  FOLLOW-UP:  Please make an appointment with your surgeon as instructed. You do not need to follow up with anesthesia unless specifically instructed to do so.  WOUND CARE INSTRUCTIONS (if applicable):  Keep a dry clean dressing on the anesthesia/puncture wound site if there is drainage.  Once the wound has quit draining you may leave it open to air.  Generally you should leave the bandage intact for twenty four hours unless there is drainage.  If the epidural site drains for more than 36-48 hours please call the anesthesia department.  QUESTIONS?:  Please feel free to call your physician or the hospital operator if you have any questions, and they will be happy to assist you.

## 2015-07-06 ENCOUNTER — Encounter (HOSPITAL_COMMUNITY)
Admission: RE | Admit: 2015-07-06 | Discharge: 2015-07-06 | Disposition: A | Discharge status: Home / Self Care | Payer: 59 | Source: Ambulatory Visit | Attending: General Surgery | Admitting: General Surgery

## 2015-07-06 ENCOUNTER — Encounter (HOSPITAL_COMMUNITY): Payer: Self-pay

## 2015-07-06 DIAGNOSIS — K8020 Calculus of gallbladder without cholecystitis without obstruction: Secondary | ICD-10-CM | POA: Insufficient documentation

## 2015-07-06 DIAGNOSIS — Z01818 Encounter for other preprocedural examination: Secondary | ICD-10-CM | POA: Insufficient documentation

## 2015-07-06 DIAGNOSIS — K8050 Calculus of bile duct without cholangitis or cholecystitis without obstruction: Secondary | ICD-10-CM | POA: Insufficient documentation

## 2015-07-06 HISTORY — DX: Other specified disorders of nose and nasal sinuses: J34.89

## 2015-07-06 HISTORY — DX: Cluster headache syndrome, unspecified, not intractable: G44.009

## 2015-07-06 HISTORY — DX: Headache: R51

## 2015-07-06 HISTORY — DX: Unspecified ovarian cyst, unspecified side: N83.209

## 2015-07-06 HISTORY — DX: Headache, unspecified: R51.9

## 2015-07-06 HISTORY — DX: Urinary tract infection, site not specified: N39.0

## 2015-07-06 HISTORY — DX: Wheezing: R06.2

## 2015-07-06 LAB — BASIC METABOLIC PANEL
ANION GAP: 8 (ref 5–15)
BUN: 9 mg/dL (ref 6–20)
CALCIUM: 8.6 mg/dL — AB (ref 8.9–10.3)
CO2: 27 mmol/L (ref 22–32)
Chloride: 103 mmol/L (ref 101–111)
Creatinine, Ser: 0.74 mg/dL (ref 0.44–1.00)
GFR calc Af Amer: >60 mL/min (ref >60)
GFR calc non Af Amer: >60 mL/min (ref >60)
Glucose, Bld: 83 mg/dL (ref 65–99)
Potassium: 4.3 mmol/L (ref 3.5–5.1)
SODIUM: 138 mmol/L (ref 135–145)

## 2015-07-06 LAB — CBC WITH DIFFERENTIAL/PLATELET
BASOS ABS: 0 10*3/uL (ref 0.0–0.1)
Basophils Relative: 0 % (ref 0–1)
Eosinophils Absolute: 0.1 10*3/uL (ref 0.0–0.7)
Eosinophils Relative: 1 % (ref 0–5)
HCT: 38.3 % (ref 36.0–46.0)
Hemoglobin: 12.7 g/dL (ref 12.0–15.0)
Lymphocytes Relative: 31 % (ref 12–46)
Lymphs Abs: 2.2 10*3/uL (ref 0.7–4.0)
MCH: 31.4 pg (ref 26.0–34.0)
MCHC: 33.2 g/dL (ref 30.0–36.0)
MCV: 94.6 fL (ref 78.0–100.0)
Monocytes Absolute: 0.6 10*3/uL (ref 0.1–1.0)
Monocytes Relative: 9 % (ref 3–12)
NEUTROS ABS: 4.3 10*3/uL (ref 1.7–7.7)
Neutrophils Relative %: 59 % (ref 43–77)
PLATELETS: 276 10*3/uL (ref 150–400)
RBC: 4.05 MIL/uL (ref 3.87–5.11)
RDW: 12.5 % (ref 11.5–15.5)
WBC: 7.2 10*3/uL (ref 4.0–10.5)

## 2015-07-06 LAB — HEPATIC FUNCTION PANEL
ALK PHOS: 41 U/L (ref 38–126)
ALT: 12 U/L — ABNORMAL LOW (ref 14–54)
AST: 17 U/L (ref 15–41)
Albumin: 3.9 g/dL (ref 3.5–5.0)
Bilirubin, Direct: 0.1 mg/dL (ref 0.1–0.5)
Indirect Bilirubin: 0.3 mg/dL (ref 0.3–0.9)
Total Bilirubin: 0.4 mg/dL (ref 0.3–1.2)
Total Protein: 6.7 g/dL (ref 6.5–8.1)

## 2015-07-06 LAB — HCG, SERUM, QUALITATIVE: PREG SERUM: NEGATIVE

## 2015-07-11 ENCOUNTER — Ambulatory Visit (HOSPITAL_COMMUNITY): Payer: 59 | Admitting: Anesthesiology

## 2015-07-11 ENCOUNTER — Encounter (HOSPITAL_COMMUNITY)
Admission: RE | Disposition: A | Discharge status: Home / Self Care | Payer: Self-pay | Source: Ambulatory Visit | Attending: General Surgery

## 2015-07-11 ENCOUNTER — Encounter (HOSPITAL_COMMUNITY): Payer: Self-pay | Admitting: Anesthesiology

## 2015-07-11 ENCOUNTER — Inpatient Hospital Stay (HOSPITAL_COMMUNITY)
Admission: RE | Admit: 2015-07-11 | Discharge: 2015-07-14 | DRG: 416 | Disposition: A | Discharge status: Home / Self Care | Payer: 59 | Source: Ambulatory Visit | Attending: General Surgery | Admitting: General Surgery

## 2015-07-11 DIAGNOSIS — K5900 Constipation, unspecified: Secondary | ICD-10-CM | POA: Diagnosis not present

## 2015-07-11 DIAGNOSIS — K8020 Calculus of gallbladder without cholecystitis without obstruction: Secondary | ICD-10-CM | POA: Diagnosis present

## 2015-07-11 DIAGNOSIS — K8010 Calculus of gallbladder with chronic cholecystitis without obstruction: Principal | ICD-10-CM | POA: Diagnosis present

## 2015-07-11 DIAGNOSIS — R51 Headache: Secondary | ICD-10-CM | POA: Diagnosis not present

## 2015-07-11 DIAGNOSIS — K219 Gastro-esophageal reflux disease without esophagitis: Secondary | ICD-10-CM | POA: Diagnosis present

## 2015-07-11 HISTORY — PX: CHOLECYSTECTOMY: SHX55

## 2015-07-11 SURGERY — CHOLECYSTECTOMY
Anesthesia: General | Site: Abdomen

## 2015-07-11 MED ORDER — ONDANSETRON HCL 4 MG/2ML IJ SOLN
4.0000 mg | Freq: Once | INTRAMUSCULAR | Status: AC | PRN
  Administered 2015-07-11: 4 mg via INTRAVENOUS

## 2015-07-11 MED ORDER — SUCCINYLCHOLINE CHLORIDE 20 MG/ML IJ SOLN
INTRAMUSCULAR | Status: AC
  Filled 2015-07-11: qty 1

## 2015-07-11 MED ORDER — PROPOFOL 10 MG/ML IV BOLUS
INTRAVENOUS | Status: DC | PRN
  Administered 2015-07-11: 110 mg via INTRAVENOUS

## 2015-07-11 MED ORDER — NEOSTIGMINE METHYLSULFATE 10 MG/10ML IV SOLN
INTRAVENOUS | Status: DC | PRN
  Administered 2015-07-11: 4 mg via INTRAVENOUS

## 2015-07-11 MED ORDER — HEMOSTATIC AGENTS (NO CHARGE) OPTIME
TOPICAL | Status: DC | PRN
Start: 2015-07-11 — End: 2015-07-11
  Administered 2015-07-11: 1 via TOPICAL

## 2015-07-11 MED ORDER — ACETAMINOPHEN 325 MG PO TABS
650.0000 mg | ORAL_TABLET | Freq: Four times a day (QID) | ORAL | Status: DC | PRN
  Administered 2015-07-13 (×2): 650 mg via ORAL
  Filled 2015-07-11 (×2): qty 2

## 2015-07-11 MED ORDER — MIDAZOLAM HCL 2 MG/2ML IJ SOLN
1.0000 mg | INTRAMUSCULAR | Status: DC | PRN
Start: 2015-07-11 — End: 2015-07-11
  Administered 2015-07-11: 2 mg via INTRAVENOUS

## 2015-07-11 MED ORDER — KETOROLAC TROMETHAMINE 30 MG/ML IJ SOLN
INTRAMUSCULAR | Status: AC
  Filled 2015-07-11: qty 1

## 2015-07-11 MED ORDER — FENTANYL CITRATE (PF) 100 MCG/2ML IJ SOLN: 25.0000 ug | INTRAMUSCULAR | Status: DC | PRN

## 2015-07-11 MED ORDER — GLYCOPYRROLATE 0.2 MG/ML IJ SOLN
INTRAMUSCULAR | Status: AC
  Filled 2015-07-11: qty 5

## 2015-07-11 MED ORDER — BUPIVACAINE LIPOSOME 1.3 % IJ SUSP
INTRAMUSCULAR | Status: DC | PRN
  Administered 2015-07-11: 17 mL

## 2015-07-11 MED ORDER — ONDANSETRON HCL 4 MG/2ML IJ SOLN
4.0000 mg | Freq: Four times a day (QID) | INTRAMUSCULAR | Status: DC | PRN
  Administered 2015-07-11 – 2015-07-13 (×4): 4 mg via INTRAVENOUS
  Filled 2015-07-11 (×4): qty 2

## 2015-07-11 MED ORDER — LACTATED RINGERS IV SOLN
INTRAVENOUS | Status: DC
  Administered 2015-07-11: 12:00:00 via INTRAVENOUS

## 2015-07-11 MED ORDER — ONDANSETRON HCL 4 MG/2ML IJ SOLN
INTRAMUSCULAR | Status: AC
Start: 2015-07-11 — End: 2015-07-11
  Filled 2015-07-11: qty 2

## 2015-07-11 MED ORDER — KETOROLAC TROMETHAMINE 30 MG/ML IJ SOLN
30.0000 mg | Freq: Once | INTRAMUSCULAR | Status: AC
  Administered 2015-07-11: 30 mg via INTRAVENOUS

## 2015-07-11 MED ORDER — LIDOCAINE HCL (CARDIAC) 20 MG/ML IV SOLN
INTRAVENOUS | Status: DC | PRN
  Administered 2015-07-11: 40 mg via INTRAVENOUS

## 2015-07-11 MED ORDER — DIPHENHYDRAMINE HCL 12.5 MG/5ML PO ELIX: 12.5000 mg | ORAL_SOLUTION | Freq: Four times a day (QID) | ORAL | Status: DC | PRN

## 2015-07-11 MED ORDER — FENTANYL CITRATE (PF) 250 MCG/5ML IJ SOLN
INTRAMUSCULAR | Status: DC | PRN
  Administered 2015-07-11 (×7): 50 ug via INTRAVENOUS

## 2015-07-11 MED ORDER — ACETAMINOPHEN 650 MG RE SUPP: 650.0000 mg | Freq: Four times a day (QID) | RECTAL | Status: DC | PRN

## 2015-07-11 MED ORDER — BUPIVACAINE LIPOSOME 1.3 % IJ SUSP
INTRAMUSCULAR | Status: AC
  Filled 2015-07-11: qty 20

## 2015-07-11 MED ORDER — CIPROFLOXACIN IN D5W 400 MG/200ML IV SOLN
400.0000 mg | INTRAVENOUS | Status: AC
  Administered 2015-07-11: 400 mg via INTRAVENOUS
  Filled 2015-07-11: qty 200

## 2015-07-11 MED ORDER — CHLORHEXIDINE GLUCONATE 4 % EX LIQD: 1.0000 "application " | Freq: Once | CUTANEOUS | Status: DC

## 2015-07-11 MED ORDER — MIDAZOLAM HCL 2 MG/2ML IJ SOLN
INTRAMUSCULAR | Status: AC
  Filled 2015-07-11: qty 2

## 2015-07-11 MED ORDER — 0.9 % SODIUM CHLORIDE (POUR BTL) OPTIME
TOPICAL | Status: DC | PRN
  Administered 2015-07-11: 2000 mL

## 2015-07-11 MED ORDER — DEXAMETHASONE SODIUM PHOSPHATE 4 MG/ML IJ SOLN
4.0000 mg | Freq: Once | INTRAMUSCULAR | Status: AC
  Administered 2015-07-11: 4 mg via INTRAVENOUS

## 2015-07-11 MED ORDER — FENTANYL CITRATE (PF) 100 MCG/2ML IJ SOLN
INTRAMUSCULAR | Status: AC
  Filled 2015-07-11: qty 4

## 2015-07-11 MED ORDER — SUCCINYLCHOLINE CHLORIDE 20 MG/ML IJ SOLN
INTRAMUSCULAR | Status: DC | PRN
  Administered 2015-07-11: 100 mg via INTRAVENOUS

## 2015-07-11 MED ORDER — ONDANSETRON 4 MG PO TBDP: 4.0000 mg | ORAL_TABLET | Freq: Four times a day (QID) | ORAL | Status: DC | PRN

## 2015-07-11 MED ORDER — GLYCOPYRROLATE 0.2 MG/ML IJ SOLN
INTRAMUSCULAR | Status: DC | PRN
  Administered 2015-07-11: .7 mg via INTRAVENOUS

## 2015-07-11 MED ORDER — NEOSTIGMINE METHYLSULFATE 10 MG/10ML IV SOLN
INTRAVENOUS | Status: AC
  Filled 2015-07-11: qty 1

## 2015-07-11 MED ORDER — LACTATED RINGERS IV SOLN
INTRAVENOUS | Status: DC
  Administered 2015-07-11 – 2015-07-12 (×2): 100 mL/h via INTRAVENOUS

## 2015-07-11 MED ORDER — FENTANYL CITRATE (PF) 250 MCG/5ML IJ SOLN
INTRAMUSCULAR | Status: AC
  Filled 2015-07-11: qty 25

## 2015-07-11 MED ORDER — POVIDONE-IODINE 10 % EX OINT
TOPICAL_OINTMENT | CUTANEOUS | Status: AC
  Filled 2015-07-11: qty 2

## 2015-07-11 MED ORDER — PROPOFOL 10 MG/ML IV BOLUS
INTRAVENOUS | Status: AC
  Filled 2015-07-11: qty 20

## 2015-07-11 MED ORDER — ROCURONIUM BROMIDE 50 MG/5ML IV SOLN
INTRAVENOUS | Status: AC
  Filled 2015-07-11: qty 1

## 2015-07-11 MED ORDER — LIDOCAINE HCL (PF) 1 % IJ SOLN
INTRAMUSCULAR | Status: AC
  Filled 2015-07-11: qty 5

## 2015-07-11 MED ORDER — ONDANSETRON HCL 4 MG/2ML IJ SOLN
4.0000 mg | Freq: Once | INTRAMUSCULAR | Status: AC
  Administered 2015-07-11: 4 mg via INTRAVENOUS

## 2015-07-11 MED ORDER — OXYCODONE-ACETAMINOPHEN 5-325 MG PO TABS
1.0000 | ORAL_TABLET | ORAL | Status: DC | PRN
  Administered 2015-07-11 – 2015-07-12 (×4): 2 via ORAL
  Filled 2015-07-11 (×4): qty 2

## 2015-07-11 MED ORDER — DEXAMETHASONE SODIUM PHOSPHATE 4 MG/ML IJ SOLN
INTRAMUSCULAR | Status: AC
  Filled 2015-07-11: qty 1

## 2015-07-11 MED ORDER — GLYCOPYRROLATE 0.2 MG/ML IJ SOLN
0.2000 mg | Freq: Once | INTRAMUSCULAR | Status: AC
  Administered 2015-07-11: 0.2 mg via INTRAVENOUS

## 2015-07-11 MED ORDER — GLYCOPYRROLATE 0.2 MG/ML IJ SOLN
INTRAMUSCULAR | Status: AC
  Filled 2015-07-11: qty 1

## 2015-07-11 MED ORDER — ONDANSETRON HCL 4 MG/2ML IJ SOLN
INTRAMUSCULAR | Status: AC
  Filled 2015-07-11: qty 2

## 2015-07-11 MED ORDER — GLYCOPYRROLATE 0.2 MG/ML IJ SOLN
INTRAMUSCULAR | Status: AC
  Filled 2015-07-11: qty 3

## 2015-07-11 MED ORDER — ENOXAPARIN SODIUM 40 MG/0.4ML ~~LOC~~ SOLN
40.0000 mg | SUBCUTANEOUS | Status: DC
Start: 2015-07-12 — End: 2015-07-14
  Administered 2015-07-12 – 2015-07-14 (×3): 40 mg via SUBCUTANEOUS
  Filled 2015-07-11 (×3): qty 0.4

## 2015-07-11 MED ORDER — PANTOPRAZOLE SODIUM 40 MG PO TBEC
40.0000 mg | DELAYED_RELEASE_TABLET | Freq: Every day | ORAL | Status: DC
  Administered 2015-07-11 – 2015-07-14 (×4): 40 mg via ORAL
  Filled 2015-07-11 (×4): qty 1

## 2015-07-11 MED ORDER — HYDROMORPHONE HCL 1 MG/ML IJ SOLN
1.0000 mg | INTRAMUSCULAR | Status: DC | PRN
  Administered 2015-07-12 (×2): 1 mg via INTRAVENOUS
  Filled 2015-07-11 (×2): qty 1

## 2015-07-11 MED ORDER — ROCURONIUM BROMIDE 100 MG/10ML IV SOLN
INTRAVENOUS | Status: DC | PRN
  Administered 2015-07-11: 10 mg via INTRAVENOUS
  Administered 2015-07-11: 25 mg via INTRAVENOUS
  Administered 2015-07-11: 10 mg via INTRAVENOUS
  Administered 2015-07-11: 5 mg via INTRAVENOUS

## 2015-07-11 MED ORDER — DIPHENHYDRAMINE HCL 50 MG/ML IJ SOLN: 12.5000 mg | Freq: Four times a day (QID) | INTRAMUSCULAR | Status: DC | PRN

## 2015-07-11 SURGICAL SUPPLY — 53 items
APPLIER CLIP 11 MED OPEN (CLIP)
APPLIER CLIP 13 LRG OPEN (CLIP) ×2
BAG HAMPER (MISCELLANEOUS) ×2 IMPLANT
CELLS DAT CNTRL 66122 CELL SVR (MISCELLANEOUS) IMPLANT
CHOLANGIOGRAM CATH TAUT (CATHETERS) IMPLANT
CLEANER TIP ELECTROSURG 2X2 (MISCELLANEOUS) ×2 IMPLANT
CLIP APPLIE 11 MED OPEN (CLIP) IMPLANT
CLIP APPLIE 13 LRG OPEN (CLIP) ×1 IMPLANT
CLOTH BEACON ORANGE TIMEOUT ST (SAFETY) ×2 IMPLANT
COVER LIGHT HANDLE STERIS (MISCELLANEOUS) ×4 IMPLANT
COVER MAYO STAND XLG (DRAPE) IMPLANT
DRAPE C-ARM FOLDED MOBILE STRL (DRAPES) IMPLANT
DRAPE WARM FLUID 44X44 (DRAPE) ×2 IMPLANT
DRESSING GELFORM 100 (GAUZE/BANDAGES/DRESSINGS) IMPLANT
DURAPREP 26ML APPLICATOR (WOUND CARE) ×2 IMPLANT
ELECT BLADE 6 FLAT ULTRCLN (ELECTRODE) ×2 IMPLANT
ELECT BLADE TIP CTD 4 INCH (ELECTRODE) ×2 IMPLANT
ELECT REM PT RETURN 9FT ADLT (ELECTROSURGICAL) ×2
ELECTRODE REM PT RTRN 9FT ADLT (ELECTROSURGICAL) ×1 IMPLANT
EVACUATOR DRAINAGE 10X20 100CC (DRAIN) IMPLANT
EVACUATOR SILICONE 100CC (DRAIN)
FORMALIN 10 PREFIL 480ML (MISCELLANEOUS) ×2 IMPLANT
GAUZE SPONGE 4X4 12PLY STRL (GAUZE/BANDAGES/DRESSINGS) ×2 IMPLANT
GLOVE BIOGEL PI IND STRL 7.5 (GLOVE) ×1 IMPLANT
GLOVE BIOGEL PI INDICATOR 7.5 (GLOVE) ×1
GLOVE SURG SS PI 7.5 STRL IVOR (GLOVE) ×4 IMPLANT
GOWN STRL REUS W/TWL LRG LVL3 (GOWN DISPOSABLE) ×6 IMPLANT
HEMOSTAT SNOW SURGICEL 2X4 (HEMOSTASIS) ×2 IMPLANT
INST SET MAJOR GENERAL (KITS) ×2 IMPLANT
KIT ROOM TURNOVER APOR (KITS) ×2 IMPLANT
MANIFOLD NEPTUNE II (INSTRUMENTS) ×2 IMPLANT
NEEDLE HYPO 25X1 1.5 SAFETY (NEEDLE) ×2 IMPLANT
NS IRRIG 1000ML POUR BTL (IV SOLUTION) ×4 IMPLANT
PACK ABDOMINAL MAJOR (CUSTOM PROCEDURE TRAY) ×2 IMPLANT
PAD ARMBOARD 7.5X6 YLW CONV (MISCELLANEOUS) ×2 IMPLANT
RTRCTR WOUND ALEXIS 18CM MED (MISCELLANEOUS)
SET BASIN LINEN APH (SET/KITS/TRAYS/PACK) ×2 IMPLANT
SPONGE GAUZE 4X4 12PLY (GAUZE/BANDAGES/DRESSINGS) ×2 IMPLANT
SPONGE INTESTINAL PEANUT (DISPOSABLE) ×2 IMPLANT
SPONGE LAP 4X18 X RAY DECT (DISPOSABLE) ×2 IMPLANT
STAPLER VISISTAT (STAPLE) ×2 IMPLANT
SUT ETHILON 3 0 FSL (SUTURE) IMPLANT
SUT SILK 2 0 (SUTURE)
SUT SILK 2-0 18XBRD TIE 12 (SUTURE) IMPLANT
SUT VIC AB 0 CT1 27 (SUTURE) ×3
SUT VIC AB 0 CT1 27XBRD ANTBC (SUTURE) ×2 IMPLANT
SUT VIC AB 0 CT1 27XCR 8 STRN (SUTURE) ×1 IMPLANT
SYR 20CC LL (SYRINGE) IMPLANT
SYR 30ML LL (SYRINGE) IMPLANT
SYRINGE 10CC LL (SYRINGE) ×2 IMPLANT
TAPE CLOTH SURG 4X10 WHT LF (GAUZE/BANDAGES/DRESSINGS) ×2 IMPLANT
TOWEL BLUE STERILE X RAY DET (MISCELLANEOUS) ×2 IMPLANT
TRAY FOLEY CATH SILVER 16FR (SET/KITS/TRAYS/PACK) IMPLANT

## 2015-07-11 NOTE — Op Note (Signed)
Patient:  Stacy Swanson  DOB:  [birth year]  MRN:  [id number]   Preop Diagnosis:  Cholelithiasis, biliary colic  Postop Diagnosis:  Same  Procedure:  Open cholecystectomy  Surgeon:  Franky Macho, M.D.  Anes:  Gen. endotracheal  Indications:  Patient is a 34 year old white female who presents with biliary colic secondary to cholelithiasis. The risks and benefits of the procedure including bleeding, infection, and the possibility of hepatobiliary injury were fully explained to the patient, who gave informed consent.  Procedure note:  The patient was placed the supine position. After induction of general endotracheal anesthesia, the abdomen was prepped and draped using the usual sterile technique with DuraPrep. Surgical site confirmation was performed.  A right subcostal incision was made down to the fascia. The peritoneal cavity was entered into without difficulty. The liver was within normal limits. The gallbladder was easily identified. A retrograde dissection of the gallbladder off the liver was performed down to the infundibulum. The triangle) was then identified. The cystic artery and duct were fully identified. Both were clipped and divided without difficulty. The gallbladder was removed from the operative field. No abnormal bleeding or bile leakage was noted. Surgicel is placed the gallbladder fossa after the wound was irrigated normal saline. Posterior fascial layer was reapproximated using an 0 Vicryl running suture. The anterior abdominal wall was reapproximated using 0 Vicryl interrupted sutures. Subcutaneous layer was irrigated normal saline. Exparel was instilled the surrounding wound. The skin was closed using staples. Betadine ointment and a dry sterile dressing were applied.  All tape and needle counts were correct at the end of the procedure. Patient was awakened and transferred to PACU in stable condition.    Complications:  None  EBL:  Minimal  Specimen:   Gallbladder

## 2015-07-11 NOTE — Anesthesia Preprocedure Evaluation (Signed)
Anesthesia Evaluation  Patient identified by MRN, date of birth, ID band Patient awake    Reviewed: Allergy & Precautions, NPO status , Patient's Chart, lab work & pertinent test results  Airway Mallampati: I  TM Distance: >3 FB     Dental  (+) Teeth Intact, Dental Advisory Given   Pulmonary  Sinus drainage  breath sounds clear to auscultation        Cardiovascular negative cardio ROS  Rhythm:Regular Rate:Normal     Neuro/Psych  Headaches,    GI/Hepatic GERD-  Medicated,  Endo/Other    Renal/GU      Musculoskeletal   Abdominal   Peds  Hematology   Anesthesia Other Findings   Reproductive/Obstetrics                             Anesthesia Physical Anesthesia Plan  ASA: II  Anesthesia Plan: General   Post-op Pain Management:    Induction: Intravenous, Rapid sequence and Cricoid pressure planned  Airway Management Planned: Oral ETT  Additional Equipment:   Intra-op Plan:   Post-operative Plan: Extubation in OR  Informed Consent: I have reviewed the patients History and Physical, chart, labs and discussed the procedure including the risks, benefits and alternatives for the proposed anesthesia with the patient or authorized representative who has indicated his/her understanding and acceptance.     Plan Discussed with:   Anesthesia Plan Comments:         Anesthesia Quick Evaluation

## 2015-07-11 NOTE — Anesthesia Procedure Notes (Signed)
Procedure Name: Intubation Date/Time: 07/11/2015 11:54 AM Performed by: Glynn Octave E Pre-anesthesia Checklist: Patient identified, Patient being monitored, Timeout performed, Emergency Drugs available and Suction available Patient Re-evaluated:Patient Re-evaluated prior to inductionOxygen Delivery Method: Circle System Utilized Preoxygenation: Pre-oxygenation with 100% oxygen Intubation Type: IV induction, Rapid sequence and Cricoid Pressure applied Ventilation: Mask ventilation without difficulty Laryngoscope Size: Mac and 3 Grade View: Grade I Tube type: Oral Tube size: 7.0 mm Number of attempts: 1 Airway Equipment and Method: Stylet Placement Confirmation: ETT inserted through vocal cords under direct vision,  positive ETCO2 and breath sounds checked- equal and bilateral Secured at: 21 cm Tube secured with: Tape Dental Injury: Teeth and Oropharynx as per pre-operative assessment

## 2015-07-11 NOTE — Transfer of Care (Signed)
Immediate Anesthesia Transfer of Care Note  Patient: Stacy Swanson  Procedure(s) Performed: Procedure(s): OPEN CHOLECYSTECTOMY (N/A)  Patient Location: PACU  Anesthesia Type:General  Level of Consciousness: awake, alert  and oriented  Airway & Oxygen Therapy: Patient Spontanous Breathing and Patient connected to face mask oxygen  Post-op Assessment: Report given to RN  Post vital signs: Reviewed and stable  Last Vitals:  Filed Vitals:   07/11/15 1134  BP: 113/76  Pulse: 74  Temp: 36.7 C  Resp: 19    Complications: No apparent anesthesia complications

## 2015-07-11 NOTE — Interval H&P Note (Signed)
History and Physical Interval Note:  07/11/2015 11:21 AM  Stacy Swanson Labs  has presented today for surgery, with the diagnosis of cholelithiasis  The various methods of treatment have been discussed with the patient and family. After consideration of risks, benefits and other options for treatment, the patient has consented to  Procedure(s): CHOLECYSTECTOMY (N/A) as a surgical intervention .  The patient's history has been reviewed, patient examined, no change in status, stable for surgery.  I have reviewed the patient's chart and labs.  Questions were answered to the patient's satisfaction.     Franky Macho A

## 2015-07-12 ENCOUNTER — Encounter (HOSPITAL_COMMUNITY): Payer: Self-pay | Admitting: General Surgery

## 2015-07-12 DIAGNOSIS — K5900 Constipation, unspecified: Secondary | ICD-10-CM | POA: Diagnosis not present

## 2015-07-12 DIAGNOSIS — K8010 Calculus of gallbladder with chronic cholecystitis without obstruction: Secondary | ICD-10-CM | POA: Diagnosis present

## 2015-07-12 DIAGNOSIS — R1011 Right upper quadrant pain: Secondary | ICD-10-CM | POA: Diagnosis present

## 2015-07-12 DIAGNOSIS — K219 Gastro-esophageal reflux disease without esophagitis: Secondary | ICD-10-CM | POA: Diagnosis present

## 2015-07-12 DIAGNOSIS — R51 Headache: Secondary | ICD-10-CM | POA: Diagnosis not present

## 2015-07-12 LAB — CBC
HCT: 36.1 % (ref 36.0–46.0)
Hemoglobin: 12 g/dL (ref 12.0–15.0)
MCH: 31.3 pg (ref 26.0–34.0)
MCHC: 33.2 g/dL (ref 30.0–36.0)
MCV: 94 fL (ref 78.0–100.0)
PLATELETS: 246 10*3/uL (ref 150–400)
RBC: 3.84 MIL/uL — ABNORMAL LOW (ref 3.87–5.11)
RDW: 12.7 % (ref 11.5–15.5)
WBC: 11.8 10*3/uL — AB (ref 4.0–10.5)

## 2015-07-12 LAB — HEPATIC FUNCTION PANEL
ALT: 27 U/L (ref 14–54)
AST: 30 U/L (ref 15–41)
Albumin: 3.3 g/dL — ABNORMAL LOW (ref 3.5–5.0)
Alkaline Phosphatase: 28 U/L — ABNORMAL LOW (ref 38–126)
BILIRUBIN INDIRECT: 0.3 mg/dL (ref 0.3–0.9)
Bilirubin, Direct: 0.1 mg/dL (ref 0.1–0.5)
TOTAL PROTEIN: 5.9 g/dL — AB (ref 6.5–8.1)
Total Bilirubin: 0.4 mg/dL (ref 0.3–1.2)

## 2015-07-12 LAB — BASIC METABOLIC PANEL
ANION GAP: 6 (ref 5–15)
BUN: 8 mg/dL (ref 6–20)
CHLORIDE: 106 mmol/L (ref 101–111)
CO2: 24 mmol/L (ref 22–32)
Calcium: 8.1 mg/dL — ABNORMAL LOW (ref 8.9–10.3)
Creatinine, Ser: 0.74 mg/dL (ref 0.44–1.00)
GFR calc Af Amer: >60 mL/min (ref >60)
GFR calc non Af Amer: >60 mL/min (ref >60)
Glucose, Bld: 116 mg/dL — ABNORMAL HIGH (ref 65–99)
Potassium: 3.6 mmol/L (ref 3.5–5.1)
Sodium: 136 mmol/L (ref 135–145)

## 2015-07-12 LAB — MAGNESIUM: MAGNESIUM: 1.8 mg/dL (ref 1.7–2.4)

## 2015-07-12 LAB — PHOSPHORUS: Phosphorus: 4.8 mg/dL — ABNORMAL HIGH (ref 2.5–4.6)

## 2015-07-12 MED ORDER — SIMETHICONE 80 MG PO CHEW
80.0000 mg | CHEWABLE_TABLET | Freq: Four times a day (QID) | ORAL | Status: DC | PRN
  Administered 2015-07-12 – 2015-07-13 (×3): 80 mg via ORAL
  Filled 2015-07-12 (×3): qty 1

## 2015-07-12 MED ORDER — KCL IN DEXTROSE-NACL 20-5-0.45 MEQ/L-%-% IV SOLN
INTRAVENOUS | Status: DC
  Administered 2015-07-12 – 2015-07-13 (×2): via INTRAVENOUS

## 2015-07-12 MED ORDER — ALUM & MAG HYDROXIDE-SIMETH 200-200-20 MG/5ML PO SUSP: 30.0000 mL | ORAL | Status: DC | PRN

## 2015-07-12 NOTE — Anesthesia Postprocedure Evaluation (Signed)
  Anesthesia Post-op Note  Patient: Stacy Swanson  Procedure(s) Performed: Procedure(s): OPEN CHOLECYSTECTOMY (N/A)  Patient Location: Nursing Unit  Anesthesia Type:General  Level of Consciousness: awake, alert  and oriented  Airway and Oxygen Therapy: Patient Spontanous Breathing  Post-op Pain: mild  Post-op Assessment: Post-op Vital signs reviewed, Patient's Cardiovascular Status Stable, Respiratory Function Stable, Patent Airway, No signs of Nausea or vomiting and Adequate PO intake              Post-op Vital Signs: Reviewed and stable  Last Vitals:  Filed Vitals:   07/12/15 1021  BP: 103/68  Pulse: 62  Temp: 36.6 C  Resp: 18    Complications: No apparent anesthesia complications

## 2015-07-12 NOTE — Anesthesia Postprocedure Evaluation (Signed)
  Anesthesia Post-op Note  Patient: Stacy Swanson  Procedure(s) Performed: Procedure(s): OPEN CHOLECYSTECTOMY (N/A)  Patient Location: Nursing Unit  Anesthesia Type:General  Level of Consciousness: awake, alert  and oriented  Airway and Oxygen Therapy: Patient Spontanous Breathing  Post-op Pain: mild  Post-op Assessment: Post-op Vital signs reviewed, Patient's Cardiovascular Status Stable, Respiratory Function Stable, Patent Airway and No signs of Nausea or vomiting              Post-op Vital Signs: Reviewed and stable  Last Vitals:  Filed Vitals:   07/12/15 0617  BP: 107/56  Pulse: 72  Temp: 36.5 C  Resp: 18    Complications: No apparent anesthesia complications

## 2015-07-12 NOTE — Addendum Note (Signed)
Addendum  created 07/12/15 1058 by Moshe Salisbury, CRNA   Modules edited: Notes Section   Notes Section:  File: [id number]

## 2015-07-12 NOTE — Progress Notes (Signed)
1 Day Post-Op  Subjective: Patient having some moderate incisional pain as well as reflux.  Objective: Vital signs in last 24 hours: Temp:  [97.6 F (36.4 C)-99.2 F (37.3 C)] 97.7 F (36.5 C) (08/11 0617) Pulse Rate:  [46-74] 72 (08/11 0617) Resp:  [11-19] 18 (08/11 0617) BP: (94-116)/(48-76) 107/56 mmHg (08/11 0617) SpO2:  [96 %-100 %] 100 % (08/11 0617) Weight:  [81.194 kg (179 lb)] 81.194 kg (179 lb) (08/10 1134) Last BM Date: 07/10/15  Intake/Output from previous day: 08/10 0701 - 08/11 0700 In: 2816.7 [P.O.:360; I.V.:2456.7] Out: 1300 [Urine:1300] Intake/Output this shift:    General appearance: alert, cooperative and no distress Resp: clear to auscultation bilaterally Cardio: regular rate and rhythm, S1, S2 normal, no murmur, click, rub or gallop GI: Soft, dressing dry and intact.  Lab Results:   Recent Labs  07/12/15 0510  WBC 11.8*  HGB 12.0  HCT 36.1  PLT 246   BMET  Recent Labs  07/12/15 0510  NA 136  K 3.6  CL 106  CO2 24  GLUCOSE 116*  BUN 8  CREATININE 0.74  CALCIUM 8.1*   PT/INR No results for input(s): LABPROT, INR in the last 72 hours.  Studies/Results: No results found.  Anti-infectives: Anti-infectives    Start     Dose/Rate Route Frequency Ordered Stop   07/11/15 1110  ciprofloxacin (CIPRO) IVPB 400 mg     400 mg 200 mL/hr over 60 Minutes Intravenous On call to O.R. 07/11/15 1110 07/11/15 1156      Assessment/Plan: s/p Procedure(s): OPEN CHOLECYSTECTOMY Impression: Stable status post day 1 open cholecystectomy. Plan: Adjust IV fluids. We'll start ambulating patient. We'll continue treating reflux.     Stacy Swanson A 07/12/2015

## 2015-07-13 MED ORDER — MAGNESIUM HYDROXIDE 400 MG/5ML PO SUSP
30.0000 mL | Freq: Four times a day (QID) | ORAL | Status: DC | PRN
  Administered 2015-07-13 – 2015-07-14 (×3): 30 mL via ORAL
  Filled 2015-07-13 (×3): qty 30

## 2015-07-13 NOTE — Progress Notes (Signed)
2 Days Post-Op  Subjective: Some nausea present. She has not had a bowel movement yet. She also complains of a headache.  Objective: Vital signs in last 24 hours: Temp:  [98.7 F (37.1 C)-98.8 F (37.1 C)] 98.7 F (37.1 C) (08/12 0432) Pulse Rate:  [62-72] 72 (08/12 0432) Resp:  [18] 18 (08/12 0432) BP: (106-131)/(51-79) 106/51 mmHg (08/12 0432) SpO2:  [99 %-100 %] 99 % (08/12 0432) Last BM Date: 07/10/15  Intake/Output from previous day: 08/11 0701 - 08/12 0700 In: 996.3 [P.O.:240; I.V.:756.3] Out: 3000 [Urine:3000] Intake/Output this shift: Total I/O In: 240 [P.O.:240] Out: -   General appearance: cooperative and fatigued Resp: clear to auscultation bilaterally Cardio: regular rate and rhythm, S1, S2 normal, no murmur, click, rub or gallop GI: Soft, incision healing well. Abdomen flat and nondistended.  Lab Results:   Recent Labs  07/12/15 0510  WBC 11.8*  HGB 12.0  HCT 36.1  PLT 246   BMET  Recent Labs  07/12/15 0510  NA 136  K 3.6  CL 106  CO2 24  GLUCOSE 116*  BUN 8  CREATININE 0.74  CALCIUM 8.1*   PT/INR No results for input(s): LABPROT, INR in the last 72 hours.  Studies/Results: No results found.  Anti-infectives: Anti-infectives    Start     Dose/Rate Route Frequency Ordered Stop   07/11/15 1110  ciprofloxacin (CIPRO) IVPB 400 mg     400 mg 200 mL/hr over 60 Minutes Intravenous On call to O.R. 07/11/15 1110 07/11/15 1156      Assessment/Plan: s/p Procedure(s): OPEN CHOLECYSTECTOMY Impression: Continues to recover, status post open cholecystectomy. Will give cathartics. We'll start ambulating patient. Anticipate discharge in next 24-48 hours.  LOS: 1 day    Caroline Matters A 07/13/2015

## 2015-07-14 MED ORDER — OXYCODONE-ACETAMINOPHEN 7.5-325 MG PO TABS: 1.0000 | ORAL_TABLET | ORAL | Status: DC | PRN

## 2015-07-14 NOTE — Progress Notes (Signed)
Patient had a moderate amount of brown loose, semi-formed stool this morning.

## 2015-07-14 NOTE — Discharge Instructions (Signed)
Open Cholecystectomy, Care After °Refer to this sheet in the next few weeks. These instructions provide you with information on caring for yourself after your procedure. Your health care provider may also give you more specific instructions. Your treatment has been planned according to current medical practices, but problems sometimes occur. Call your health care provider if you have any problems or questions after your procedure. °WHAT TO EXPECT AFTER THE PROCEDURE °After your procedure, it is typical to have the following: °· Pain at your incision site. You will be given medicines to control this pain. °· Constipation. You may be given stool softeners to help prevent this. °HOME CARE INSTRUCTIONS  °· Change bandages (dressings) as directed by your health care provider. °· Keep the wound dry and clean. You may wash the wound gently with soap and water. Gently blot or dab the wound dry. °· It is okay to take showers 48 hours after surgery. Do not take baths or use swimming pools or hot tubs for 2 weeks or until your health care provider approves. °· Only take over-the-counter or prescription medicines as directed by your health care provider. °· Continue your normal diet as directed by your health care provider. Eat plenty of fruits and vegetables to help prevent constipation. °· Drink enough fluids to keep your urine clear or pale yellow. This also helps prevent constipation. °· Do not lift anything heavier than 10 pounds (4.5 kg) for 1 month or until your health care provider approves. °· Do not play contact sports for 1 month or until your health care provider approves. °· Do not return to work or school for 4 weeks or until your health care provider approves. °SEEK MEDICAL CARE IF:  °· You have redness, swelling, or increasing pain in the wound. °· You see a yellowish-white fluid (pus) coming from the wound. °· You have drainage from the wound that lasts longer than 1 day. °· You notice a bad smell coming from  the wound or dressing. °· Your wound breaks open after the stitches (sutures) or staples have been removed. °· You have increasing pain in the shoulders (shoulder strap areas). °· You have dizzy episodes or faint while standing. °· You feel sick to your stomach (nauseous) or throw up (vomit). °SEEK IMMEDIATE MEDICAL CARE IF:  °· You have a fever. °· You develop a rash. °· You have difficulty breathing. °· You have chest pain. °· You have severe abdominal pain. °· You have leg pain. °Document Released: 03/04/2004 Document Revised: 09/07/2013 Document Reviewed: 06/29/2013 °[employer]® Patient Information ©2015 [employer]. This information is not intended to replace advice given to you by your health care provider. Make sure you discuss any questions you have with your health care provider. ° °

## 2015-07-14 NOTE — Discharge Summary (Signed)
Physician Discharge Summary  Patient ID: Stacy Swanson MRN: [id number] DOB/AGE: [birth year] 33 y.o.  Admit date: 07/11/2015 Discharge date: 07/14/2015  Admission Diagnoses: Cholecystitis, cholelithiasis  Discharge Diagnoses: Same Active Problems:   Cholelithiasis   Discharged Condition: good  Hospital Course: Patient is a 34 year old white female who presented with blurry colic secondary to cholelithiasis. She underwent an open cholecystectomy on 07/11/2015. She tolerated the procedure well. Postoperative course was remarkable for mild constipation which resolved with milk of magnesia. Her diet was advanced without difficulty. She is being discharged home on 07/14/2015 in good and improving condition.  Treatments: surgery: Open cholecystectomy on 07/11/2015  Discharge Exam: Blood pressure 108/63, pulse 71, temperature 98.7 F (37.1 C), temperature source Oral, resp. rate 20, height  (1.676 m), weight 81.194 kg (179 lb), SpO2 100 %. General appearance: alert, cooperative and no distress Resp: clear to auscultation bilaterally Cardio: regular rate and rhythm, S1, S2 normal, no murmur, click, rub or gallop GI: Soft, incision healing well.  Disposition: 01-Home or Self Care     Medication List    TAKE these medications        acetaminophen 500 MG tablet  Commonly known as:  TYLENOL  Take 500-1,000 mg by mouth every 6 (six) hours as needed for moderate pain.     cetirizine 10 MG tablet  Commonly known as:  ZYRTEC  Take 10 mg by mouth daily.     ibuprofen 200 MG tablet  Commonly known as:  ADVIL,MOTRIN  Take 400 mg by mouth every 6 (six) hours as needed for pain.     magnesium hydroxide 400 MG/5ML suspension  Commonly known as:  MILK OF MAGNESIA  Take 15 mLs by mouth daily as needed for mild constipation.     naproxen sodium 220 MG tablet  Commonly known as:  ANAPROX  Take 220-440 mg by mouth daily as needed (cramping).     norethindrone 0.35 MG tablet   Commonly known as:  MICRONOR,CAMILA,ERRIN  Take 1 tablet by mouth daily.     omeprazole 20 MG capsule  Commonly known as:  PRILOSEC  Take 20 mg by mouth daily.     ondansetron 8 MG tablet  Commonly known as:  ZOFRAN  Take 4 mg by mouth every 8 (eight) hours as needed for nausea or vomiting.     oxyCODONE-acetaminophen 7.5-325 MG per tablet  Commonly known as:  PERCOCET  Take 1-2 tablets by mouth every 4 (four) hours as needed.     PHILLIPS STOOL SOFTENER 100 MG capsule  Generic drug:  docusate sodium  Take 100 mg by mouth 2 (two) times daily.     polyethylene glycol packet  Commonly known as:  MIRALAX  Take 17 g by mouth daily.     promethazine 12.5 MG tablet  Commonly known as:  PHENERGAN  Take 1 tablet (12.5 mg total) by mouth every 6 (six) hours as needed for nausea or vomiting.     pseudoephedrine-acetaminophen 30-500 MG Tabs  Commonly known as:  TYLENOL SINUS  Take 1 tablet by mouth every 4 (four) hours as needed (Headache).           Follow-up Information    Follow up with Dalia Heading, MD. Schedule an appointment as soon as possible for a visit on 07/19/2015.   Specialty:  General Surgery   Contact information:   [address] [place] [address] [phone]       Signed: Franky Macho A 07/14/2015, 9:58 AM

## 2015-07-14 NOTE — Progress Notes (Signed)
Patient states understanding of discharge instructions, prescription given. 

## 2015-11-15 ENCOUNTER — Encounter: Payer: Self-pay | Admitting: Internal Medicine

## 2015-12-07 ENCOUNTER — Encounter: Payer: Self-pay | Admitting: Gastroenterology

## 2015-12-07 ENCOUNTER — Ambulatory Visit (INDEPENDENT_AMBULATORY_CARE_PROVIDER_SITE_OTHER): Payer: BLUE CROSS/BLUE SHIELD | Admitting: Gastroenterology

## 2015-12-07 VITALS — BP 114/76 | HR 75 | Temp 97.0°F | Ht 66.0 in | Wt 178.4 lb

## 2015-12-07 DIAGNOSIS — K589 Irritable bowel syndrome without diarrhea: Secondary | ICD-10-CM | POA: Diagnosis not present

## 2015-12-07 MED ORDER — ONDANSETRON HCL 4 MG PO TABS: 4.0000 mg | ORAL_TABLET | Freq: Three times a day (TID) | ORAL | Status: DC | PRN

## 2015-12-07 MED ORDER — LINACLOTIDE 145 MCG PO CAPS: 145.0000 ug | ORAL_CAPSULE | Freq: Every day | ORAL | Status: DC

## 2015-12-07 NOTE — Assessment & Plan Note (Signed)
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 Historically, bloating, abdominal pain, and nausea improved with better bowel regimen. Currently on Miralax and has taken multiple OTCs as well. Will trial Linzess 145 mcg daily. May need an increase to 290 mcg, but we will start here first. No warning signs/symptoms. Return in 3 months to evaluate further.

## 2015-12-07 NOTE — Progress Notes (Signed)
Primary Care Physician:  Roe Rutherford, NP/Dr. Margo Aye Primary Gastroenterologist:  Dr. Jena Gauss   Chief Complaint  Patient presents with  . Abdominal Pain  . Abdominal Cramping  . Bloated  . Constipation  . Nausea    HPI:   Stacy Swanson is a 35 y.o. female presenting today at the request of Roe Rutherford, NP, patient's PCP. Here to be evaluated for multiple symptoms.   Chronic constipation historically. However, new onset in November 2016 of other symptoms. Has been taking Miralax every day. MOM and stool softener not working. Never felt like being completely productive. Started having severe, doubling over stomach cramps, had some associated nausea. States she has felt her abdomen spasming. Has intermittent abdominal cramping. With stomach cramps would have soft stools, not runny. Normally has a BM about once a day with Miralax. Bentyl has helped with cramping, only taking as needed. Doesn't take every day. Will just take one with good results. Still taking Miralax daily. Bristol scale about 3-4 with Miralax. If no Miralax, will have Bristol scale #1-2. Sometimes feels like has to go a bit more but unable to. Has bouts of nausea associated with feeling more stopped up. Sinus issues worsen nausea. On Protonix, if forgets will have a flare of indigestion. No rectal bleeding. No weight loss of consistent loss of appetite.   Past Medical History  Diagnosis Date  . Gallstones   . Sinus drainage   . Sinus headache   . Wheezing without diagnosis of asthma     with exertion and high humidity  . Frequent UTI   . Ovarian cyst   . Cluster headaches     Past Surgical History  Procedure Laterality Date  . Tonsillectomy    . Wisdom tooth extraction  age 62  . Oophorectomy  1994    open with midline incision, 20+ cm cyst, multiple  . Cholecystectomy N/A 07/11/2015    Procedure: OPEN CHOLECYSTECTOMY;  Surgeon: Franky Macho, MD;  Location: [facility];  Service: General;  Laterality:  N/A;    Current Outpatient Prescriptions  Medication Sig Dispense Refill  . acetaminophen (TYLENOL) 500 MG tablet Take 500-1,000 mg by mouth every 6 (six) hours as needed for moderate pain.    . cetirizine (ZYRTEC) 10 MG tablet Take 10 mg by mouth daily.    Marland Kitchen ibuprofen (ADVIL,MOTRIN) 200 MG tablet Take 400 mg by mouth every 6 (six) hours as needed for pain.    . magnesium hydroxide (MILK OF MAGNESIA) 400 MG/5ML suspension Take 15 mLs by mouth daily as needed for mild constipation.    . naproxen sodium (ANAPROX) 220 MG tablet Take 220-440 mg by mouth daily as needed (cramping).    . norethindrone (MICRONOR,CAMILA,ERRIN) 0.35 MG tablet Take 1 tablet by mouth daily.    . ondansetron (ZOFRAN) 8 MG tablet Take 4 mg by mouth every 8 (eight) hours as needed for nausea or vomiting.    . pantoprazole (PROTONIX) 40 MG tablet TK 1 T PO D  4  . polyethylene glycol (MIRALAX) packet Take 17 g by mouth daily. 14 each 0  . pseudoephedrine-acetaminophen (TYLENOL SINUS) 30-500 MG TABS Take 1 tablet by mouth every 4 (four) hours as needed (Headache).    . traMADol (ULTRAM) 50 MG tablet Take by mouth every 6 (six) hours as needed.     No current facility-administered medications for this visit.    Allergies as of 12/07/2015 - Review Complete 12/07/2015  Allergen Reaction Noted  . Codeine Other (  See Comments) 07/09/2011  . Lactose intolerance (gi) Other (See Comments) 02/22/2013    Family History  Problem Relation Age of Onset  . Colon cancer Neg Hx   . Gallbladder disease Mother     Social History   Social History  . Marital Status: Single    Spouse Name: N/A  . Number of Children: N/A  . Years of Education: N/A   Occupational History  . 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 Smoking status: Never Smoker   . Smokeless tobacco: Not on file  . Alcohol Use: 0.0 oz/week    0 Standard drinks or equivalent per week     Comment: used to drink 1 beer a  day, now just once a week at the most   . Drug Use: No  . Sexual Activity: Yes    Birth Control/ Protection: Pill   Other Topics Concern  . Not on file   Social History Narrative    Review of Systems: As mentioned in HPI   Physical Exam: BP 114/76 mmHg  Pulse 75  Temp(Src) 97 F (36.1 C)  Ht 5\' 6"  (1.676 m)  Wt 178 lb 6.4 oz (80.922 kg)  BMI 28.81 kg/m2  LMP 11/28/2015 (Approximate) General:   Alert and oriented. Pleasant and cooperative. Well-nourished and well-developed.  Head:  Normocephalic and atraumatic. Eyes:  Without icterus, sclera clear and conjunctiva pink.  Ears:  Normal auditory acuity. Nose:  No deformity, discharge,  or lesions. Mouth:  No deformity or lesions, oral mucosa pink.  Lungs:  Clear to auscultation bilaterally. No wheezes, rales, or rhonchi. No distress.  Heart:  S1, S2 present without murmurs appreciated.  Abdomen:  +BS, soft, non-tender and non-distended. No HSM noted. No guarding or rebound. No masses appreciated.  Rectal:  Deferred  Msk:  Symmetrical without gross deformities. Normal posture. Extremities:  Without  edema. Neurologic:  Alert and  oriented x4;  grossly normal neurologically. Psych:  Alert and cooperative. Normal mood and affect.

## 2015-12-07 NOTE — Patient Instructions (Signed)
Stop Miralax. Start taking Linzess 1 capsule each morning, 30 minutes before breakfast. I have given samples and sent the prescription to your pharmacy.  We will see you back in 3 months or sooner if needed!

## 2015-12-07 NOTE — Progress Notes (Signed)
cc'ed to pcp °

## 2016-01-03 ENCOUNTER — Telehealth: Payer: Self-pay | Admitting: Internal Medicine

## 2016-01-03 NOTE — Telephone Encounter (Signed)
Tried to call pt back to get more information- NA- LMOM

## 2016-01-03 NOTE — Telephone Encounter (Signed)
[phone]   PLEASE CALL PATIENT  STATED THAT HER LINZESS STOPPED WORKING AFTER A WEEK

## 2016-01-04 NOTE — Telephone Encounter (Signed)
Spoke with the pt, she said the linzess was working good and then she started going 4-5 days without a bm and would have to take miralax in addition to the linzess. She wanted to know if she could increase her dosage. Per [facility] ov note, we are going to start with linzess but may have to increase it to . I have offered the pt samples of linzess and she would like to try them. I have put #5 boxes at the front desk for her to pick up.

## 2016-01-04 NOTE — Telephone Encounter (Signed)
Completely agree. Thanks! 

## 2016-01-29 ENCOUNTER — Telehealth: Payer: Self-pay

## 2016-01-29 NOTE — Telephone Encounter (Signed)
Pt is calling about her Linzess. Sometimes it works and sometimes it does not. It is making her bloated and gassy. When she does have to go to the bathroom it is diarrhea with sometimes not making it to the bathroom. She is wanting to know if she should keep taking it or go back to taking the Miralax daily. She only has one pill left. Please advise

## 2016-01-29 NOTE — Telephone Encounter (Signed)
May trial Amitiza 8 mcg po BID. May pick up samples.

## 2016-01-31 NOTE — Telephone Encounter (Signed)
Tired to call with no answer 

## 2016-01-31 NOTE — Telephone Encounter (Signed)
Pt called office and is aware of Tobi Bastos wanting her to try the Amitiza. Pt is aware that sample are at front desk for pick up

## 2016-03-10 ENCOUNTER — Ambulatory Visit: Payer: BLUE CROSS/BLUE SHIELD | Admitting: Gastroenterology

## 2016-03-25 ENCOUNTER — Ambulatory Visit (INDEPENDENT_AMBULATORY_CARE_PROVIDER_SITE_OTHER): Payer: BLUE CROSS/BLUE SHIELD | Admitting: Gastroenterology

## 2016-03-25 ENCOUNTER — Encounter: Payer: Self-pay | Admitting: Gastroenterology

## 2016-03-25 VITALS — BP 121/79 | HR 69 | Temp 96.5°F | Ht 66.0 in | Wt 181.2 lb

## 2016-03-25 DIAGNOSIS — K589 Irritable bowel syndrome without diarrhea: Secondary | ICD-10-CM

## 2016-03-25 NOTE — Progress Notes (Signed)
Referring Provider: Benita Stabile, MD Primary Care Physician:  Dwana Melena, MD  Primary GI: Dr. Jena Gauss   Chief Complaint  Patient presents with  . Follow-up    HPI:   Stacy Swanson is a 35 y.o. 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   Returns stating that both Linzess and Amitiza caused a lot of bloating. She took some activated charcoal due to bloating, which helped tremendously. Felt like she looked a few months pregnant. Takes Miralax every morning with juice, having a BM usually daily. No rectal bleeding. No abdominal pain. Occasional stomach cramps related to monthly cycle. Will sometimes have some gurgling in stomach. Probiotics cause bloating. Tries yogurt and that is a little better. Didn't seem to help significantly with bloating. Miralax works the best for her.   Past Medical History  Diagnosis Date  . Gallstones   . Sinus drainage   . Sinus headache   . Wheezing without diagnosis of asthma     with exertion and high humidity  . Frequent UTI   . Ovarian cyst   . Cluster headaches     Past Surgical History  Procedure Laterality Date  . Tonsillectomy    . Wisdom tooth extraction  age 26  . Oophorectomy  1994    open with midline incision, 20+ cm cyst, multiple  . Cholecystectomy N/A 07/11/2015    Procedure: OPEN CHOLECYSTECTOMY;  Surgeon: Franky Macho, MD;  Location: [facility];  Service: General;  Laterality: N/A;    Current Outpatient Prescriptions  Medication Sig Dispense Refill  . acetaminophen (TYLENOL) 500 MG tablet Take 500-1,000 mg by mouth every 6 (six) hours as needed for moderate pain.    . cetirizine (ZYRTEC) 10 MG tablet Take 10 mg by mouth daily.    Marland Kitchen ibuprofen (ADVIL,MOTRIN) 200 MG tablet Take 400 mg by mouth every 6 (six) hours as needed for pain.    . magnesium hydroxide (MILK OF MAGNESIA) 400 MG/5ML suspension Take 15 mLs by mouth daily as needed  for mild constipation.    . naproxen sodium (ANAPROX) 220 MG tablet Take 220-440 mg by mouth daily as needed (cramping).    . norethindrone (MICRONOR,CAMILA,ERRIN) 0.35 MG tablet Take 1 tablet by mouth daily.    . ondansetron (ZOFRAN) 4 MG tablet Take 1 tablet (4 mg total) by mouth every 8 (eight) hours as needed for nausea or vomiting. 30 tablet 1  . pantoprazole (PROTONIX) 40 MG tablet TK 1 T PO D  4  . polyethylene glycol (MIRALAX) packet Take 17 g by mouth daily. 14 each 0  . pseudoephedrine-acetaminophen (TYLENOL SINUS) 30-500 MG TABS Take 1 tablet by mouth every 4 (four) hours as needed (Headache).    . traMADol (ULTRAM) 50 MG tablet Take by mouth every 6 (six) hours as needed.     No current facility-administered medications for this visit.    Allergies as of 03/25/2016 - Review Complete 03/25/2016  Allergen Reaction Noted  . Codeine Other (See Comments) 07/09/2011  . Lactose intolerance (gi) Other (See Comments) 02/22/2013    Family History  Problem Relation Age of Onset  . Colon cancer Neg Hx   . Gallbladder disease Mother     Social History   Social History  . Marital Status: Single    Spouse Name: N/A  . Number of Children: N/A  . Years of Education: N/A  Occupational History  . 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 Smoking status: Never Smoker   . Smokeless tobacco: None  . Alcohol Use: 0.0 oz/week    0 Standard drinks or equivalent per week     Comment: used to drink 1 beer a day, now just once a week at the most   . Drug Use: No  . Sexual Activity: Yes    Birth Control/ Protection: Pill   Other Topics Concern  . None   Social History Narrative    Review of Systems: Negative unless mentioned in HPI   Physical Exam: BP 121/79 mmHg  Pulse 69  Temp(Src) 96.5 F (35.8 C)  Ht 5\' 6"  (1.676 m)  Wt 181 lb 3.2 oz (82.192 kg)  BMI 29.26 kg/m2  LMP 03/03/2016 (Approximate) General:   Alert and oriented. No  distress noted. Pleasant and cooperative.  Head:  Normocephalic and atraumatic. Eyes:  Conjuctiva clear without scleral icterus. Abdomen:  +BS, soft, non-tender and non-distended. No rebound or guarding. No HSM or masses noted. Msk:  Symmetrical without gross deformities. Normal posture. Extremities:  Without edema. Neurologic:  Alert and  oriented x4;  grossly normal neurologically. Psych:  Alert and cooperative. Normal mood and affect.

## 2016-03-25 NOTE — Patient Instructions (Signed)
Continue Miralax once daily as you have been doing.  Call me if you need anything else, or your symptoms change.   We will see you back as needed!

## 2016-03-26 NOTE — Assessment & Plan Note (Signed)
Constipation predominant. Linzess and Amitiza both caused bloating and abdominal discomfort. She has done quite well with Miralax. Will continue this for now. No concerning lower or upper GI signs/symptoms. First screening colonoscopy age 35 or sooner if clinically indicated. Doing well, so we will see her back prn.

## 2016-03-27 NOTE — Progress Notes (Signed)
cc'ed to pcp °

## 2016-05-03 ENCOUNTER — Encounter: Payer: Self-pay | Admitting: Gastroenterology

## 2016-05-05 ENCOUNTER — Telehealth: Payer: Self-pay | Admitting: Gastroenterology

## 2016-05-05 NOTE — Telephone Encounter (Signed)
Please arrange a hydrogen breath test for patient. Thanks!

## 2016-05-06 ENCOUNTER — Other Ambulatory Visit: Payer: Self-pay

## 2016-05-06 NOTE — Telephone Encounter (Signed)
Tried to call with no answer  

## 2016-05-07 ENCOUNTER — Other Ambulatory Visit: Payer: Self-pay

## 2016-05-07 DIAGNOSIS — R140 Abdominal distension (gaseous): Secondary | ICD-10-CM

## 2016-05-07 DIAGNOSIS — R109 Unspecified abdominal pain: Secondary | ICD-10-CM

## 2016-05-07 NOTE — Telephone Encounter (Signed)
Tried to call with no answer  

## 2016-05-07 NOTE — Telephone Encounter (Signed)
Pt is set up for HBT on 05/12/16 @ 07:00. Instructions are in the mail and she is aware of date and time

## 2016-05-12 ENCOUNTER — Ambulatory Visit (HOSPITAL_COMMUNITY)
Admission: RE | Admit: 2016-05-12 | Discharge: 2016-05-12 | Disposition: A | Discharge status: Home / Self Care | Payer: BLUE CROSS/BLUE SHIELD | Source: Ambulatory Visit | Attending: Internal Medicine | Admitting: Internal Medicine

## 2016-05-12 ENCOUNTER — Encounter (HOSPITAL_COMMUNITY)
Admission: RE | Disposition: A | Discharge status: Home / Self Care | Payer: Self-pay | Source: Ambulatory Visit | Attending: Internal Medicine

## 2016-05-12 SURGERY — BREATH TEST, FOR INTESTINAL BACTERIAL OVERGROWTH

## 2016-05-12 MED ORDER — LACTULOSE 10 GM/15ML PO SOLN
25.0000 g | Freq: Once | ORAL | Status: AC
  Administered 2016-05-12: 25 g via ORAL

## 2016-05-12 MED ORDER — LACTULOSE 10 GM/15ML PO SOLN
ORAL | Status: AC
  Filled 2016-05-12: qty 60

## 2016-05-12 NOTE — OR Nursing (Signed)
No beans, bran or high fiber cereal the day before the procedure?NO NPO except for water 12 hours before procedure? Yes No smoking, sleeping or vigorous exercising for at least 30 before procedure? NO Recent antibiotic use and/or diarrhea? NO   If yes, physician notified.  There was no fluctuation in the patient's readings. Notified [employer] who calibrated the machine X 1 and the machine would not zero. [employer] notifed again and attempted to calibrate the machine and the machine would not give any reading after the patient breathed into it. Dr. Jena Gaussourk and Candy [place]Jordan at the office notified of the above. [employer] to send the machine in for maintenance and will reschedule the procedure once the machine is back. Patient verbalized understanding of the above.   Time Baseline 15 mins 30 mins 45 mins 60 mins 75 mins 90 mins 105 mins 120 mins 135 mins 150 mins 165 mins 180 mins  H2-ppm 0 1 2 2 2  2

## 2016-05-14 ENCOUNTER — Ambulatory Visit: Payer: BLUE CROSS/BLUE SHIELD | Admitting: Gastroenterology

## 2016-05-21 ENCOUNTER — Telehealth: Payer: Self-pay

## 2016-05-21 NOTE — Telephone Encounter (Signed)
Called pt and LMOM to call office back to set up HBT per Gerrit HallsAnna Sams request

## 2016-05-28 NOTE — Telephone Encounter (Signed)
Pt called back and she would like to wait on the HBT for now. She will let us know if anything changes

## 2016-05-28 NOTE — Telephone Encounter (Signed)
Tried to call with no answer  

## 2016-06-04 DIAGNOSIS — R7301 Impaired fasting glucose: Secondary | ICD-10-CM | POA: Diagnosis not present

## 2016-06-04 DIAGNOSIS — J302 Other seasonal allergic rhinitis: Secondary | ICD-10-CM | POA: Diagnosis not present

## 2016-06-04 DIAGNOSIS — K589 Irritable bowel syndrome without diarrhea: Secondary | ICD-10-CM | POA: Diagnosis not present

## 2016-06-04 DIAGNOSIS — Z0000 Encounter for general adult medical examination without abnormal findings: Secondary | ICD-10-CM | POA: Diagnosis not present

## 2016-06-04 DIAGNOSIS — K581 Irritable bowel syndrome with constipation: Secondary | ICD-10-CM | POA: Diagnosis not present

## 2016-06-04 DIAGNOSIS — K210 Gastro-esophageal reflux disease with esophagitis: Secondary | ICD-10-CM | POA: Diagnosis not present

## 2016-07-02 DIAGNOSIS — N939 Abnormal uterine and vaginal bleeding, unspecified: Secondary | ICD-10-CM | POA: Diagnosis not present

## 2016-08-30 IMAGING — CT CT ABD-PELV W/ CM
2 of 4 series · 16 of 46 positions shown, 18 images · IV contrast (omnipaque)
Comparison: None.

CLINICAL DATA: Periumbilical abdominal pain. Nausea.
Cholelithiasis.

EXAM:
CT ABDOMEN AND PELVIS WITH CONTRAST
TECHNIQUE: Multidetector CT imaging of the abdomen and pelvis was performed
using the standard protocol following bolus administration of
intravenous contrast.
CONTRAST:  100mL OMNIPAQUE IOHEXOL 300 MG/ML  SOLN

[Series 2: abd_pel_with 5.0 b40f · axial · 0.77mm/px · z∈[-486,-40]mm · 13 of 97 slices shown, 15 images]
[im 4/97  soft-tissue]
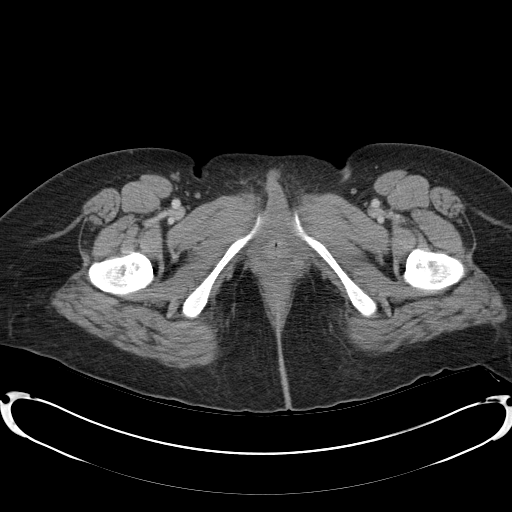
[im 4/97  bone]
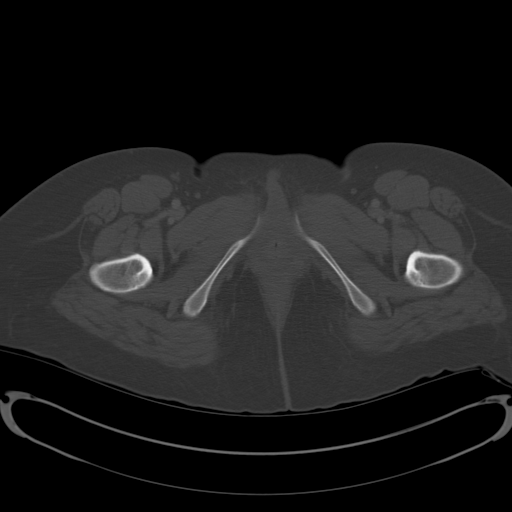
[im 12/97  soft-tissue]
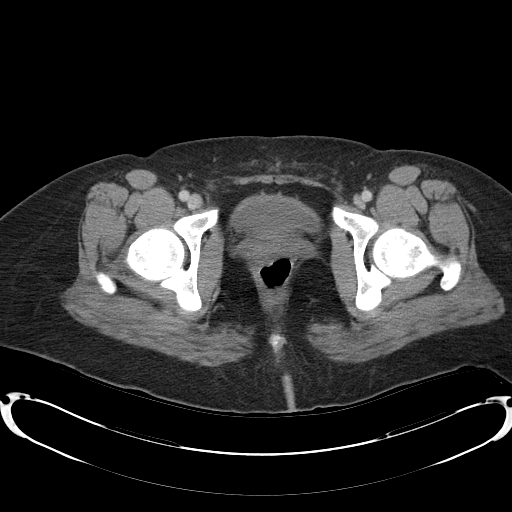
[im 20/97  soft-tissue]
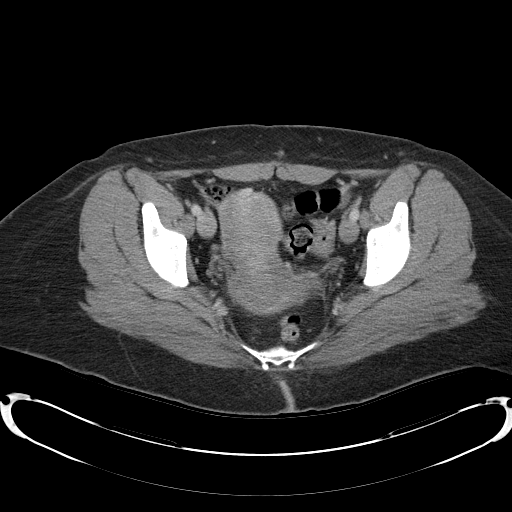
[im 27/97  soft-tissue]
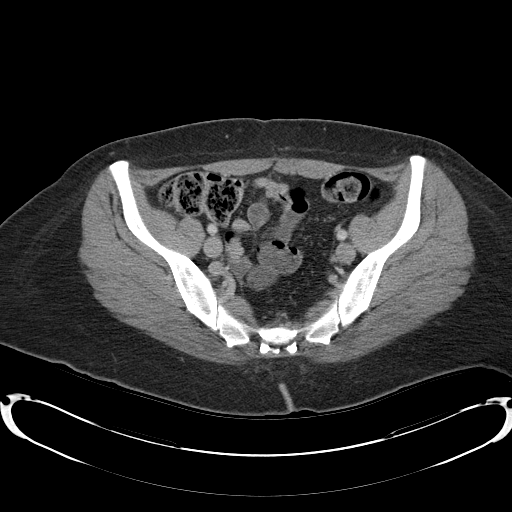
[im 35/97  soft-tissue]
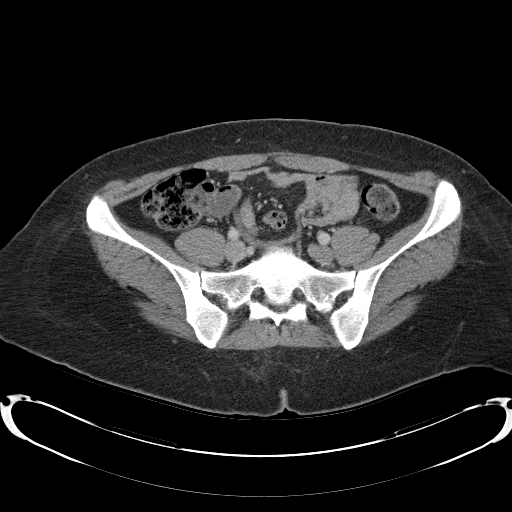
[im 43/97  soft-tissue]
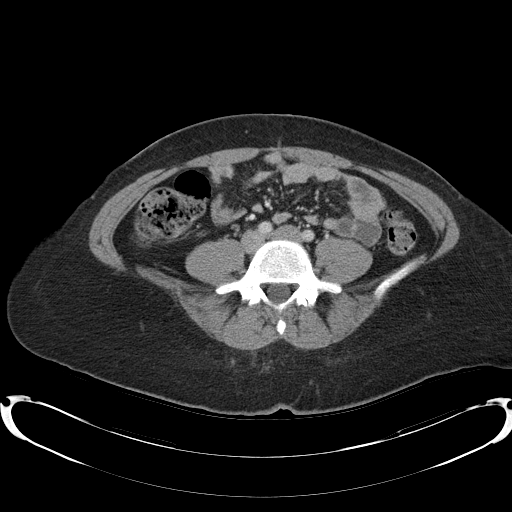
[im 50/97  soft-tissue]
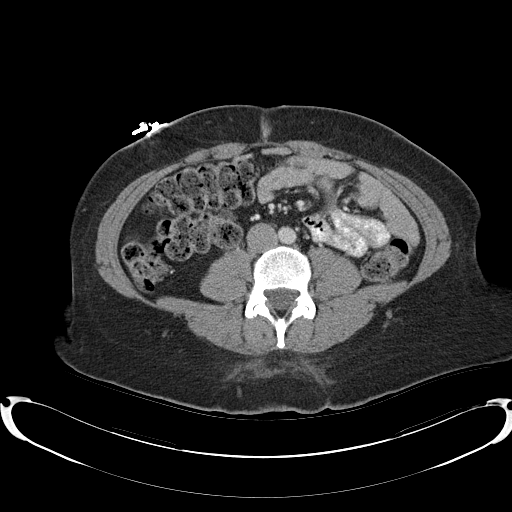
[im 54/97  soft-tissue]
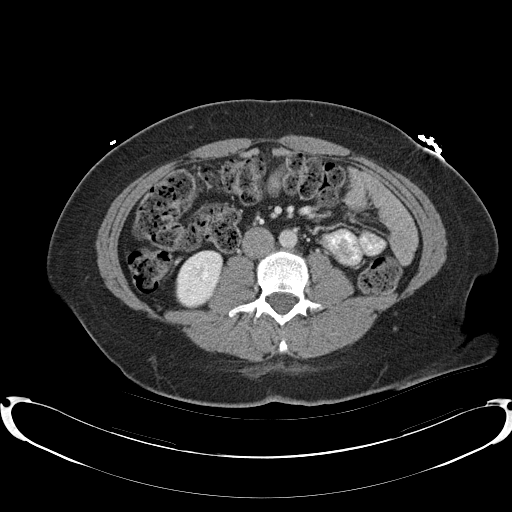
[im 62/97  soft-tissue]
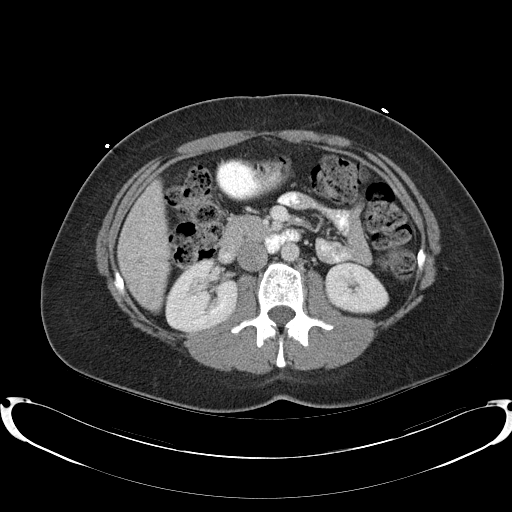
[im 62/97  bone]
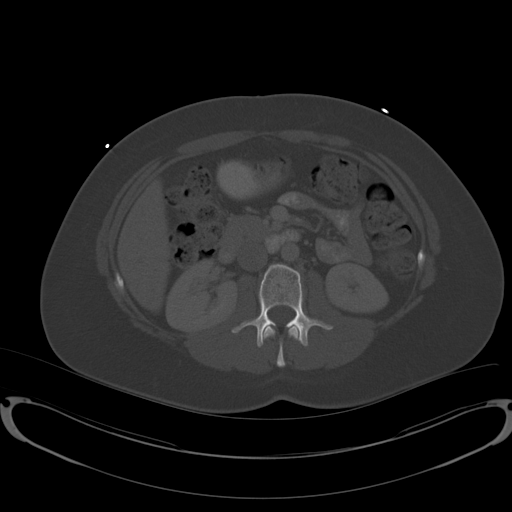
[im 70/97  soft-tissue]
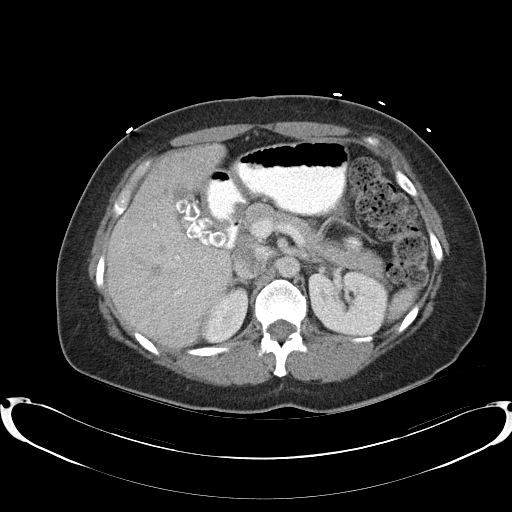
[im 77/97  soft-tissue]
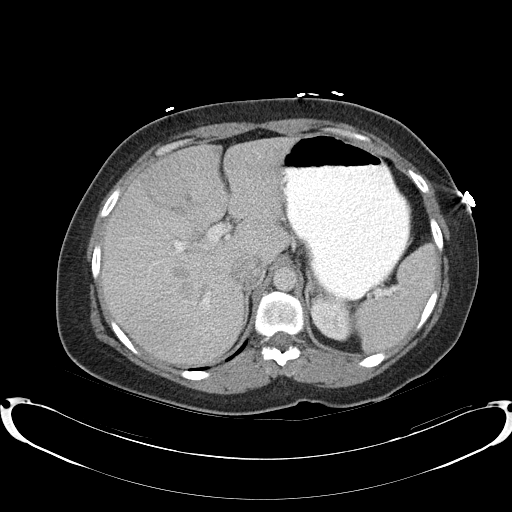
[im 85/97  soft-tissue]
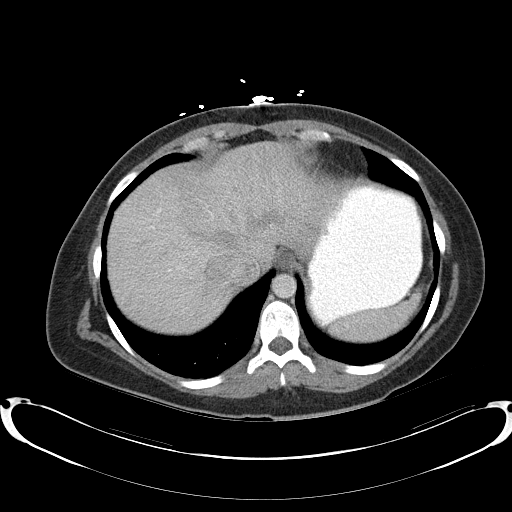
[im 93/97  soft-tissue]
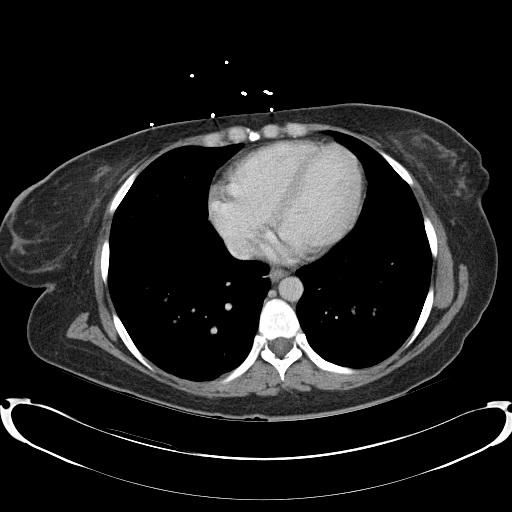

[Series 3: abd_pel_with 3.0 spo cor · coronal · 0.72mm/px · 3 of 91 slices shown]
[im 31/91  soft-tissue]
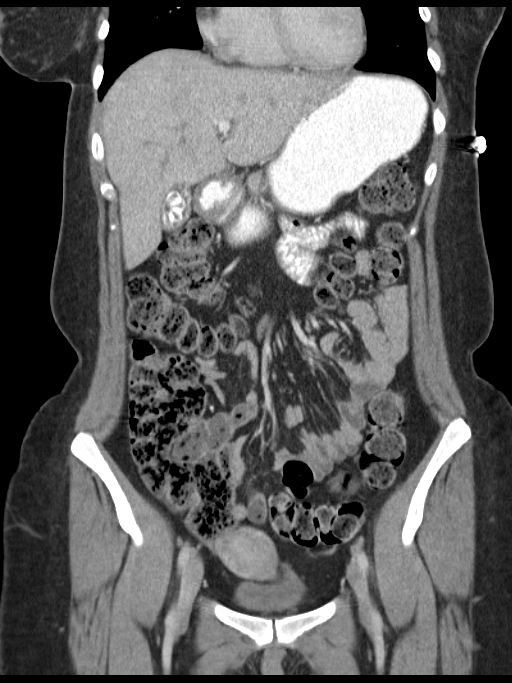
[im 41/91  soft-tissue]
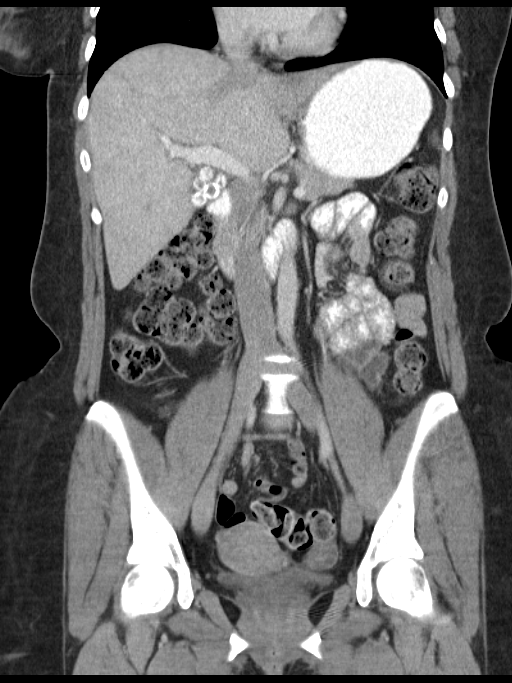
[im 51/91  soft-tissue]
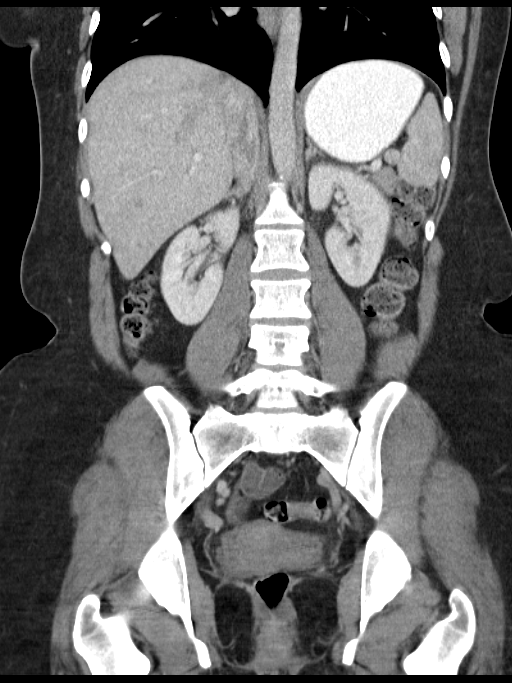

[16 of 46 positions shown; findings below may reference images not displayed]

FINDINGS: Lower Chest:  Unremarkable.

Hepatobiliary: No masses or other significant abnormality
identified. Numerous calcified gallstones are seen however there is
no evidence of acute cholecystitis or biliary dilatation.

Pancreas: No mass, inflammatory changes, or other significant
abnormality identified.

Spleen:  Within normal limits in size and appearance.

Adrenals:  No masses identified.

Kidneys/Urinary Tract:  No evidence of masses or hydronephrosis.

Stomach/Bowel/Peritoneum: No evidence of wall thickening, mass, or
obstruction. Large amount of stool is seen throughout colon.

Vascular/Lymphatic: No pathologically enlarged lymph nodes
identified. No other significant abnormality visualized.

Reproductive:  No mass or other significant abnormality identified.

Other:  None.

Musculoskeletal:  No suspicious bone lesions identified.
IMPRESSION: Cholelithiasis. No radiographic evidence of cholecystitis or other
acute findings.

Large stool burden noted; suggest clinical correlation for possible
constipation.

## 2016-10-13 DIAGNOSIS — S0093XA Contusion of unspecified part of head, initial encounter: Secondary | ICD-10-CM | POA: Diagnosis not present

## 2016-10-13 DIAGNOSIS — S0003XA Contusion of scalp, initial encounter: Secondary | ICD-10-CM | POA: Diagnosis not present

## 2017-03-30 ENCOUNTER — Other Ambulatory Visit: Payer: Self-pay | Admitting: Gastroenterology

## 2017-04-08 DIAGNOSIS — L298 Other pruritus: Secondary | ICD-10-CM | POA: Diagnosis not present

## 2017-04-08 DIAGNOSIS — Z6831 Body mass index (BMI) 31.0-31.9, adult: Secondary | ICD-10-CM | POA: Diagnosis not present

## 2017-06-04 DIAGNOSIS — Z0000 Encounter for general adult medical examination without abnormal findings: Secondary | ICD-10-CM | POA: Diagnosis not present

## 2017-06-08 DIAGNOSIS — J302 Other seasonal allergic rhinitis: Secondary | ICD-10-CM | POA: Diagnosis not present

## 2017-06-08 DIAGNOSIS — K219 Gastro-esophageal reflux disease without esophagitis: Secondary | ICD-10-CM | POA: Diagnosis not present

## 2017-06-08 DIAGNOSIS — Z0001 Encounter for general adult medical examination with abnormal findings: Secondary | ICD-10-CM | POA: Diagnosis not present

## 2017-06-08 DIAGNOSIS — K581 Irritable bowel syndrome with constipation: Secondary | ICD-10-CM | POA: Diagnosis not present

## 2017-06-11 DIAGNOSIS — R21 Rash and other nonspecific skin eruption: Secondary | ICD-10-CM | POA: Diagnosis not present

## 2017-06-11 DIAGNOSIS — L299 Pruritus, unspecified: Secondary | ICD-10-CM | POA: Diagnosis not present

## 2017-09-07 DIAGNOSIS — Z23 Encounter for immunization: Secondary | ICD-10-CM | POA: Diagnosis not present

## 2017-09-13 ENCOUNTER — Other Ambulatory Visit: Payer: Self-pay | Admitting: Gastroenterology

## 2017-09-15 ENCOUNTER — Other Ambulatory Visit: Payer: Self-pay | Admitting: Gastroenterology

## 2017-09-28 DIAGNOSIS — F331 Major depressive disorder, recurrent, moderate: Secondary | ICD-10-CM | POA: Diagnosis not present

## 2017-11-02 DIAGNOSIS — F331 Major depressive disorder, recurrent, moderate: Secondary | ICD-10-CM | POA: Diagnosis not present

## 2017-12-08 DIAGNOSIS — F331 Major depressive disorder, recurrent, moderate: Secondary | ICD-10-CM | POA: Diagnosis not present

## 2017-12-14 DIAGNOSIS — R300 Dysuria: Secondary | ICD-10-CM | POA: Diagnosis not present

## 2017-12-25 DIAGNOSIS — M9902 Segmental and somatic dysfunction of thoracic region: Secondary | ICD-10-CM | POA: Diagnosis not present

## 2017-12-25 DIAGNOSIS — M9901 Segmental and somatic dysfunction of cervical region: Secondary | ICD-10-CM | POA: Diagnosis not present

## 2017-12-25 DIAGNOSIS — M546 Pain in thoracic spine: Secondary | ICD-10-CM | POA: Diagnosis not present

## 2017-12-29 DIAGNOSIS — M9901 Segmental and somatic dysfunction of cervical region: Secondary | ICD-10-CM | POA: Diagnosis not present

## 2017-12-29 DIAGNOSIS — G43009 Migraine without aura, not intractable, without status migrainosus: Secondary | ICD-10-CM | POA: Diagnosis not present

## 2017-12-29 DIAGNOSIS — M9902 Segmental and somatic dysfunction of thoracic region: Secondary | ICD-10-CM | POA: Diagnosis not present

## 2017-12-31 DIAGNOSIS — M9902 Segmental and somatic dysfunction of thoracic region: Secondary | ICD-10-CM | POA: Diagnosis not present

## 2017-12-31 DIAGNOSIS — G43009 Migraine without aura, not intractable, without status migrainosus: Secondary | ICD-10-CM | POA: Diagnosis not present

## 2017-12-31 DIAGNOSIS — M546 Pain in thoracic spine: Secondary | ICD-10-CM | POA: Diagnosis not present

## 2017-12-31 DIAGNOSIS — M9901 Segmental and somatic dysfunction of cervical region: Secondary | ICD-10-CM | POA: Diagnosis not present

## 2018-01-06 DIAGNOSIS — F331 Major depressive disorder, recurrent, moderate: Secondary | ICD-10-CM | POA: Diagnosis not present

## 2018-01-07 DIAGNOSIS — M546 Pain in thoracic spine: Secondary | ICD-10-CM | POA: Diagnosis not present

## 2018-01-07 DIAGNOSIS — G43009 Migraine without aura, not intractable, without status migrainosus: Secondary | ICD-10-CM | POA: Diagnosis not present

## 2018-01-07 DIAGNOSIS — M9901 Segmental and somatic dysfunction of cervical region: Secondary | ICD-10-CM | POA: Diagnosis not present

## 2018-01-07 DIAGNOSIS — M9902 Segmental and somatic dysfunction of thoracic region: Secondary | ICD-10-CM | POA: Diagnosis not present

## 2018-01-14 DIAGNOSIS — M546 Pain in thoracic spine: Secondary | ICD-10-CM | POA: Diagnosis not present

## 2018-01-14 DIAGNOSIS — G43009 Migraine without aura, not intractable, without status migrainosus: Secondary | ICD-10-CM | POA: Diagnosis not present

## 2018-01-14 DIAGNOSIS — M9901 Segmental and somatic dysfunction of cervical region: Secondary | ICD-10-CM | POA: Diagnosis not present

## 2018-01-14 DIAGNOSIS — M9902 Segmental and somatic dysfunction of thoracic region: Secondary | ICD-10-CM | POA: Diagnosis not present

## 2018-01-21 DIAGNOSIS — G43009 Migraine without aura, not intractable, without status migrainosus: Secondary | ICD-10-CM | POA: Diagnosis not present

## 2018-01-21 DIAGNOSIS — M9901 Segmental and somatic dysfunction of cervical region: Secondary | ICD-10-CM | POA: Diagnosis not present

## 2018-01-21 DIAGNOSIS — M546 Pain in thoracic spine: Secondary | ICD-10-CM | POA: Diagnosis not present

## 2018-01-21 DIAGNOSIS — M9902 Segmental and somatic dysfunction of thoracic region: Secondary | ICD-10-CM | POA: Diagnosis not present

## 2018-01-28 DIAGNOSIS — G43009 Migraine without aura, not intractable, without status migrainosus: Secondary | ICD-10-CM | POA: Diagnosis not present

## 2018-01-28 DIAGNOSIS — M9902 Segmental and somatic dysfunction of thoracic region: Secondary | ICD-10-CM | POA: Diagnosis not present

## 2018-01-28 DIAGNOSIS — M546 Pain in thoracic spine: Secondary | ICD-10-CM | POA: Diagnosis not present

## 2018-01-28 DIAGNOSIS — M9901 Segmental and somatic dysfunction of cervical region: Secondary | ICD-10-CM | POA: Diagnosis not present

## 2018-02-08 DIAGNOSIS — M546 Pain in thoracic spine: Secondary | ICD-10-CM | POA: Diagnosis not present

## 2018-02-08 DIAGNOSIS — G43009 Migraine without aura, not intractable, without status migrainosus: Secondary | ICD-10-CM | POA: Diagnosis not present

## 2018-02-08 DIAGNOSIS — M9902 Segmental and somatic dysfunction of thoracic region: Secondary | ICD-10-CM | POA: Diagnosis not present

## 2018-02-08 DIAGNOSIS — M9901 Segmental and somatic dysfunction of cervical region: Secondary | ICD-10-CM | POA: Diagnosis not present

## 2018-02-09 DIAGNOSIS — F331 Major depressive disorder, recurrent, moderate: Secondary | ICD-10-CM | POA: Diagnosis not present

## 2018-02-22 DIAGNOSIS — M9902 Segmental and somatic dysfunction of thoracic region: Secondary | ICD-10-CM | POA: Diagnosis not present

## 2018-02-22 DIAGNOSIS — M546 Pain in thoracic spine: Secondary | ICD-10-CM | POA: Diagnosis not present

## 2018-02-22 DIAGNOSIS — M9901 Segmental and somatic dysfunction of cervical region: Secondary | ICD-10-CM | POA: Diagnosis not present

## 2018-02-22 DIAGNOSIS — G43009 Migraine without aura, not intractable, without status migrainosus: Secondary | ICD-10-CM | POA: Diagnosis not present

## 2018-03-17 DIAGNOSIS — M9901 Segmental and somatic dysfunction of cervical region: Secondary | ICD-10-CM | POA: Diagnosis not present

## 2018-03-17 DIAGNOSIS — G43009 Migraine without aura, not intractable, without status migrainosus: Secondary | ICD-10-CM | POA: Diagnosis not present

## 2018-03-17 DIAGNOSIS — M545 Low back pain: Secondary | ICD-10-CM | POA: Diagnosis not present

## 2018-03-17 DIAGNOSIS — M9903 Segmental and somatic dysfunction of lumbar region: Secondary | ICD-10-CM | POA: Diagnosis not present

## 2018-03-22 DIAGNOSIS — M9903 Segmental and somatic dysfunction of lumbar region: Secondary | ICD-10-CM | POA: Diagnosis not present

## 2018-03-22 DIAGNOSIS — G43009 Migraine without aura, not intractable, without status migrainosus: Secondary | ICD-10-CM | POA: Diagnosis not present

## 2018-03-22 DIAGNOSIS — M545 Low back pain: Secondary | ICD-10-CM | POA: Diagnosis not present

## 2018-03-22 DIAGNOSIS — M9901 Segmental and somatic dysfunction of cervical region: Secondary | ICD-10-CM | POA: Diagnosis not present

## 2018-03-31 DIAGNOSIS — M545 Low back pain: Secondary | ICD-10-CM | POA: Diagnosis not present

## 2018-03-31 DIAGNOSIS — M9901 Segmental and somatic dysfunction of cervical region: Secondary | ICD-10-CM | POA: Diagnosis not present

## 2018-03-31 DIAGNOSIS — M9903 Segmental and somatic dysfunction of lumbar region: Secondary | ICD-10-CM | POA: Diagnosis not present

## 2018-03-31 DIAGNOSIS — G43009 Migraine without aura, not intractable, without status migrainosus: Secondary | ICD-10-CM | POA: Diagnosis not present

## 2018-04-20 DIAGNOSIS — M545 Low back pain: Secondary | ICD-10-CM | POA: Diagnosis not present

## 2018-04-20 DIAGNOSIS — M9903 Segmental and somatic dysfunction of lumbar region: Secondary | ICD-10-CM | POA: Diagnosis not present

## 2018-04-20 DIAGNOSIS — F331 Major depressive disorder, recurrent, moderate: Secondary | ICD-10-CM | POA: Diagnosis not present

## 2018-04-20 DIAGNOSIS — M9901 Segmental and somatic dysfunction of cervical region: Secondary | ICD-10-CM | POA: Diagnosis not present

## 2018-04-20 DIAGNOSIS — G43009 Migraine without aura, not intractable, without status migrainosus: Secondary | ICD-10-CM | POA: Diagnosis not present

## 2018-05-11 DIAGNOSIS — M9901 Segmental and somatic dysfunction of cervical region: Secondary | ICD-10-CM | POA: Diagnosis not present

## 2018-05-11 DIAGNOSIS — M9902 Segmental and somatic dysfunction of thoracic region: Secondary | ICD-10-CM | POA: Diagnosis not present

## 2018-05-11 DIAGNOSIS — M546 Pain in thoracic spine: Secondary | ICD-10-CM | POA: Diagnosis not present

## 2018-05-11 DIAGNOSIS — G43009 Migraine without aura, not intractable, without status migrainosus: Secondary | ICD-10-CM | POA: Diagnosis not present

## 2018-05-18 DIAGNOSIS — F331 Major depressive disorder, recurrent, moderate: Secondary | ICD-10-CM | POA: Diagnosis not present

## 2018-05-26 DIAGNOSIS — F411 Generalized anxiety disorder: Secondary | ICD-10-CM | POA: Diagnosis not present

## 2018-06-08 DIAGNOSIS — M9901 Segmental and somatic dysfunction of cervical region: Secondary | ICD-10-CM | POA: Diagnosis not present

## 2018-06-08 DIAGNOSIS — M546 Pain in thoracic spine: Secondary | ICD-10-CM | POA: Diagnosis not present

## 2018-06-08 DIAGNOSIS — G43009 Migraine without aura, not intractable, without status migrainosus: Secondary | ICD-10-CM | POA: Diagnosis not present

## 2018-06-10 DIAGNOSIS — Z0000 Encounter for general adult medical examination without abnormal findings: Secondary | ICD-10-CM | POA: Diagnosis not present

## 2018-06-10 DIAGNOSIS — R300 Dysuria: Secondary | ICD-10-CM | POA: Diagnosis not present

## 2018-06-15 DIAGNOSIS — F331 Major depressive disorder, recurrent, moderate: Secondary | ICD-10-CM | POA: Diagnosis not present

## 2018-06-23 DIAGNOSIS — F411 Generalized anxiety disorder: Secondary | ICD-10-CM | POA: Diagnosis not present

## 2018-07-06 DIAGNOSIS — G43009 Migraine without aura, not intractable, without status migrainosus: Secondary | ICD-10-CM | POA: Diagnosis not present

## 2018-07-06 DIAGNOSIS — M9901 Segmental and somatic dysfunction of cervical region: Secondary | ICD-10-CM | POA: Diagnosis not present

## 2018-07-06 DIAGNOSIS — M546 Pain in thoracic spine: Secondary | ICD-10-CM | POA: Diagnosis not present

## 2018-07-20 DIAGNOSIS — F331 Major depressive disorder, recurrent, moderate: Secondary | ICD-10-CM | POA: Diagnosis not present

## 2018-08-03 DIAGNOSIS — M9901 Segmental and somatic dysfunction of cervical region: Secondary | ICD-10-CM | POA: Diagnosis not present

## 2018-08-03 DIAGNOSIS — G43009 Migraine without aura, not intractable, without status migrainosus: Secondary | ICD-10-CM | POA: Diagnosis not present

## 2018-08-03 DIAGNOSIS — M9902 Segmental and somatic dysfunction of thoracic region: Secondary | ICD-10-CM | POA: Diagnosis not present

## 2018-08-05 DIAGNOSIS — Z124 Encounter for screening for malignant neoplasm of cervix: Secondary | ICD-10-CM | POA: Diagnosis not present

## 2018-08-05 DIAGNOSIS — Z0001 Encounter for general adult medical examination with abnormal findings: Secondary | ICD-10-CM | POA: Diagnosis not present

## 2018-08-05 DIAGNOSIS — R300 Dysuria: Secondary | ICD-10-CM | POA: Diagnosis not present

## 2018-08-05 DIAGNOSIS — K581 Irritable bowel syndrome with constipation: Secondary | ICD-10-CM | POA: Diagnosis not present

## 2018-08-16 DIAGNOSIS — M9901 Segmental and somatic dysfunction of cervical region: Secondary | ICD-10-CM | POA: Diagnosis not present

## 2018-08-16 DIAGNOSIS — G43009 Migraine without aura, not intractable, without status migrainosus: Secondary | ICD-10-CM | POA: Diagnosis not present

## 2018-08-16 DIAGNOSIS — M546 Pain in thoracic spine: Secondary | ICD-10-CM | POA: Diagnosis not present

## 2018-08-16 DIAGNOSIS — M9902 Segmental and somatic dysfunction of thoracic region: Secondary | ICD-10-CM | POA: Diagnosis not present

## 2018-08-24 DIAGNOSIS — M9902 Segmental and somatic dysfunction of thoracic region: Secondary | ICD-10-CM | POA: Diagnosis not present

## 2018-08-24 DIAGNOSIS — M9901 Segmental and somatic dysfunction of cervical region: Secondary | ICD-10-CM | POA: Diagnosis not present

## 2018-08-24 DIAGNOSIS — M546 Pain in thoracic spine: Secondary | ICD-10-CM | POA: Diagnosis not present

## 2018-09-27 DIAGNOSIS — G43009 Migraine without aura, not intractable, without status migrainosus: Secondary | ICD-10-CM | POA: Diagnosis not present

## 2018-09-27 DIAGNOSIS — M9901 Segmental and somatic dysfunction of cervical region: Secondary | ICD-10-CM | POA: Diagnosis not present

## 2018-09-27 DIAGNOSIS — M546 Pain in thoracic spine: Secondary | ICD-10-CM | POA: Diagnosis not present

## 2018-09-27 DIAGNOSIS — M9902 Segmental and somatic dysfunction of thoracic region: Secondary | ICD-10-CM | POA: Diagnosis not present

## 2018-09-28 DIAGNOSIS — F331 Major depressive disorder, recurrent, moderate: Secondary | ICD-10-CM | POA: Diagnosis not present

## 2018-10-20 DIAGNOSIS — F331 Major depressive disorder, recurrent, moderate: Secondary | ICD-10-CM | POA: Diagnosis not present

## 2018-10-25 DIAGNOSIS — M9902 Segmental and somatic dysfunction of thoracic region: Secondary | ICD-10-CM | POA: Diagnosis not present

## 2018-10-25 DIAGNOSIS — M546 Pain in thoracic spine: Secondary | ICD-10-CM | POA: Diagnosis not present

## 2018-10-25 DIAGNOSIS — M9901 Segmental and somatic dysfunction of cervical region: Secondary | ICD-10-CM | POA: Diagnosis not present

## 2018-10-25 DIAGNOSIS — G43009 Migraine without aura, not intractable, without status migrainosus: Secondary | ICD-10-CM | POA: Diagnosis not present

## 2018-11-02 DIAGNOSIS — Z23 Encounter for immunization: Secondary | ICD-10-CM | POA: Diagnosis not present

## 2018-11-22 DIAGNOSIS — M545 Low back pain: Secondary | ICD-10-CM | POA: Diagnosis not present

## 2018-11-22 DIAGNOSIS — G43009 Migraine without aura, not intractable, without status migrainosus: Secondary | ICD-10-CM | POA: Diagnosis not present

## 2018-11-22 DIAGNOSIS — M9901 Segmental and somatic dysfunction of cervical region: Secondary | ICD-10-CM | POA: Diagnosis not present

## 2018-11-22 DIAGNOSIS — M9903 Segmental and somatic dysfunction of lumbar region: Secondary | ICD-10-CM | POA: Diagnosis not present

## 2020-03-15 ENCOUNTER — Other Ambulatory Visit: Payer: Self-pay

## 2020-03-15 ENCOUNTER — Ambulatory Visit: Payer: Self-pay | Attending: Internal Medicine

## 2020-03-15 ENCOUNTER — Ambulatory Visit
Admission: EM | Admit: 2020-03-15 | Discharge: 2020-03-15 | Disposition: A | Discharge status: Home / Self Care | Payer: Self-pay

## 2020-03-15 DIAGNOSIS — J208 Acute bronchitis due to other specified organisms: Secondary | ICD-10-CM

## 2020-03-15 DIAGNOSIS — Z20822 Contact with and (suspected) exposure to covid-19: Secondary | ICD-10-CM | POA: Insufficient documentation

## 2020-03-15 MED ORDER — FLUTICASONE PROPIONATE 50 MCG/ACT NA SUSP: 1.0000 | Freq: Every day | NASAL | 0 refills | Status: DC

## 2020-03-15 MED ORDER — AZITHROMYCIN 250 MG PO TABS: 250.0000 mg | ORAL_TABLET | Freq: Every day | ORAL | 0 refills | Status: DC

## 2020-03-15 NOTE — ED Provider Notes (Signed)
[facility]    CSN: [id number] Arrival date & time: 03/15/20  1740      History   Chief Complaint Chief Complaint  Patient presents with  . Cough    HPI Stacy Swanson is a 39 y.o. female.   Who presented to the urgent care with a complaint of cough with yellowish dark mucus and sore throat for the past 5 days.  Has been taking prednisone and Tessalon Perles with no relief.  Denies sick exposure to COVID, flu or strep.  Tested for Covid this morning and is awaiting results.  Denies recent travel.  Denies aggravating or alleviating symptoms.  Denies previous COVID infection.   Denies fever, chills, fatigue, nasal congestion, rhinorrhea, , SOB, wheezing, chest pain, nausea, vomiting, changes in bowel or bladder habits.    The history is provided by the patient. No language interpreter was used.  Cough Associated symptoms: sore throat     Past Medical History:  Diagnosis Date  . Cluster headaches   . Frequent UTI   . Gallstones   . Ovarian cyst   . Sinus drainage   . Sinus headache   . Wheezing without diagnosis of asthma    with exertion and high humidity    Patient Active Problem List   Diagnosis Date Noted  . IBS (irritable bowel syndrome) 12/07/2015  . Cholelithiasis 07/11/2015    Past Surgical History:  Procedure Laterality Date  . CHOLECYSTECTOMY N/A 07/11/2015   Procedure: OPEN CHOLECYSTECTOMY;  Surgeon: Franky Macho, MD;  Location: [facility];  Service: General;  Laterality: N/A;  . OOPHORECTOMY  1994   open with midline incision, 20+ cm cyst, multiple  . TONSILLECTOMY    . WISDOM TOOTH EXTRACTION  age 24    OB History   No obstetric history on file.      Home Medications    Prior to Admission medications   Medication Sig Start Date End Date Taking? Authorizing Provider  acetaminophen (TYLENOL) 500 MG tablet Take 500-1,000 mg by mouth every 6 (six) hours as needed for moderate pain.    [provider]  azithromycin (ZITHROMAX)  250 MG tablet Take 1 tablet (250 mg total) by mouth daily. Take first 2 tablets together, then 1 every day until finished. 03/15/20   Mahesh Sizemore, Zachery Dakins, FNP  cetirizine (ZYRTEC) 10 MG tablet Take 10 mg by mouth daily.    [provider]  Charcoal Activated (ACTIVATED CHARCOAL PO) Take 1 tablet by mouth daily.    [provider]  fluticasone (FLONASE) 50 MCG/ACT nasal spray Place 1 spray into both nostrils daily for 14 days. 03/15/20 03/29/20  Lorelie Biermann, Zachery Dakins, FNP  ibuprofen (ADVIL,MOTRIN) 200 MG tablet Take 400 mg by mouth every 6 (six) hours as needed for pain.    [provider]  naproxen sodium (ANAPROX) 220 MG tablet Take 220-440 mg by mouth daily as needed (cramping).    [provider]  norethindrone (MICRONOR,CAMILA,ERRIN) 0.35 MG tablet Take 1 tablet by mouth daily.    [provider]  ondansetron (ZOFRAN) 4 MG tablet TAKE 1 TABLET(4 MG) BY MOUTH EVERY 8 HOURS AS NEEDED FOR NAUSEA OR VOMITING 09/15/17   Tiffany Kocher, PA-C  ondansetron (ZOFRAN) 4 MG tablet TAKE 1 TABLET(4 MG) BY MOUTH EVERY 8 HOURS AS NEEDED FOR NAUSEA OR VOMITING 09/20/17   Tiffany Kocher, PA-C  pantoprazole (PROTONIX) 40 MG tablet Take one tablet daily. 12/01/15   [provider]  polyethylene glycol (MIRALAX) packet Take 17  g by mouth daily. 01/14/15   Janne Napoleon, NP  pseudoephedrine-acetaminophen (TYLENOL SINUS) 30-500 MG TABS Take 1 tablet by mouth every 4 (four) hours as needed (Headache).    [provider]  traMADol (ULTRAM) 50 MG tablet Take by mouth every 6 (six) hours as needed.    [provider]  venlafaxine XR (EFFEXOR-XR) 75 MG 24 hr capsule Take 75 mg by mouth daily. 02/12/20   [provider]    Family History Family History  Problem Relation Age of Onset  . Gallbladder disease Mother   . Colon cancer Neg Hx     Social History Social History   Tobacco Use  . Smoking status: Never Smoker  Substance Use Topics  .  Alcohol use: Yes    Alcohol/week: 0.0 standard drinks    Comment: used to drink 1 beer a day, now just once a week at the most   . Drug use: No     Allergies   Codeine and Lactose intolerance (gi)   Review of Systems Review of Systems  Constitutional: Negative.   HENT: Positive for sore throat.   Respiratory: Positive for cough.   Cardiovascular: Negative.   Gastrointestinal: Negative.   Neurological: Negative.      Physical Exam Triage Vital Signs ED Triage Vitals  Enc Vitals Group     BP      Pulse      Resp      Temp      Temp src      SpO2      Weight      Height      Head Circumference      Peak Flow      Pain Score      Pain Loc      Pain Edu?      Excl. in GC?    No data found.  Updated Vital Signs BP 122/84   Pulse 78   Temp 98.2 F (36.8 C)   Resp 18   LMP 03/06/2020 (Approximate)   SpO2 99%   Visual Acuity Right Eye Distance:   Left Eye Distance:   Bilateral Distance:    Right Eye Near:   Left Eye Near:    Bilateral Near:     Physical Exam Vitals and nursing note reviewed.  Constitutional:      General: She is not in acute distress.    Appearance: Normal appearance. She is normal weight. She is not ill-appearing or toxic-appearing.  HENT:     Head: Normocephalic.     Right Ear: Tympanic membrane, ear canal and external ear normal. There is no impacted cerumen.     Left Ear: Tympanic membrane, ear canal and external ear normal. There is no impacted cerumen.     Nose: Nose normal. No congestion.     Mouth/Throat:     Lips: Pink.     Mouth: Mucous membranes are moist.     Dentition: Normal dentition.     Pharynx: Oropharynx is clear. No pharyngeal swelling, oropharyngeal exudate or posterior oropharyngeal erythema.     Tonsils: 1+ on the right. 1+ on the left.  Cardiovascular:     Rate and Rhythm: Normal rate and regular rhythm.     Pulses: Normal pulses.     Heart sounds: Normal heart sounds. No murmur.  Pulmonary:     Effort:  Pulmonary effort is normal. No respiratory distress.     Breath sounds: Normal breath sounds. No wheezing or rhonchi.  Chest:     Chest wall: No tenderness.  Neurological:     Mental Status: She is alert and oriented to person, place, and time.      UC Treatments / Results  Labs (all labs ordered are listed, but only abnormal results are displayed) Labs Reviewed - No data to display  EKG   Radiology No results found.  Procedures Procedures (including critical care time)  Medications Ordered in UC Medications - No data to display  Initial Impression / Assessment and Plan / UC Course  I have reviewed the triage vital signs and the nursing notes.  Pertinent labs & imaging results that were available during my care of the patient were reviewed by me and considered in my medical decision making (see chart for details).   Patient is stable for discharge.  Flonase and azithromycin were prescribed.  Was advised to follow-up with PCP.  To return or go to ED for worsening of symptoms.   Final Clinical Impressions(s) / UC Diagnoses   Final diagnoses:  Acute bronchitis due to other specified organisms     Discharge Instructions     Continue to take prednisone as prescribed Continue to take Tessalon as prescribed Flonase and azithromycin were prescribed Follow-up with PCP Return or go to ED for worsening of symptoms    ED Prescriptions    Medication Sig Dispense Auth. Provider   azithromycin (ZITHROMAX) 250 MG tablet Take 1 tablet (250 mg total) by mouth daily. Take first 2 tablets together, then 1 every day until finished. 6 tablet Nanette Wirsing S, FNP   fluticasone (FLONASE) 50 MCG/ACT nasal spray Place 1 spray into both nostrils daily for 14 days. 16 g Emerson Monte, FNP     PDMP not reviewed this encounter.   Emerson Monte, FNP 03/15/20 1819

## 2020-03-15 NOTE — Discharge Instructions (Signed)
Continue to take prednisone as prescribed Continue to take Tessalon as prescribed Flonase and azithromycin were prescribed Follow-up with PCP Return or go to ED for worsening of symptoms

## 2020-03-15 NOTE — ED Triage Notes (Signed)
Pt presents with c/o cough and sore throat since Sunday , voice is hoarse . Pt has been taking prednisone and tesselon pearls

## 2020-03-16 LAB — NOVEL CORONAVIRUS, NAA: SARS-CoV-2, NAA: NOT DETECTED

## 2020-03-16 LAB — SARS-COV-2, NAA 2 DAY TAT

## 2020-12-13 ENCOUNTER — Other Ambulatory Visit: Payer: Self-pay

## 2020-12-13 DIAGNOSIS — Z20822 Contact with and (suspected) exposure to covid-19: Secondary | ICD-10-CM

## 2020-12-16 LAB — NOVEL CORONAVIRUS, NAA: SARS-CoV-2, NAA: DETECTED — AB

## 2021-08-21 ENCOUNTER — Other Ambulatory Visit: Payer: Self-pay

## 2021-08-21 ENCOUNTER — Ambulatory Visit
Admission: EM | Admit: 2021-08-21 | Discharge: 2021-08-21 | Disposition: A | Discharge status: Home / Self Care | Payer: Managed Care, Other (non HMO) | Attending: Physician Assistant | Admitting: Physician Assistant

## 2021-08-21 ENCOUNTER — Encounter: Payer: Self-pay | Admitting: *Deleted

## 2021-08-21 DIAGNOSIS — H6691 Otitis media, unspecified, right ear: Secondary | ICD-10-CM | POA: Diagnosis not present

## 2021-08-21 MED ORDER — PROMETHAZINE HCL 25 MG PO TABS: 25.0000 mg | ORAL_TABLET | Freq: Four times a day (QID) | ORAL | 0 refills | Status: DC | PRN

## 2021-08-21 MED ORDER — AMOXICILLIN 500 MG PO CAPS: 500.0000 mg | ORAL_CAPSULE | Freq: Three times a day (TID) | ORAL | 0 refills | Status: DC

## 2021-08-21 NOTE — Discharge Instructions (Signed)
Return if any problems.

## 2021-08-21 NOTE — ED Triage Notes (Signed)
Pt reports HA started Sunday . Pt also has Rt ear pain with drainage .

## 2021-08-25 NOTE — ED Provider Notes (Signed)
[facility]    CSN: [id number] Arrival date & time: 08/21/21  1017      History   Chief Complaint Chief Complaint  Patient presents with   Otalgia    RT   Nausea   Headache    HPI Stacy Swanson is a 40 y.o. female.   The history is provided by the patient and a parent. No language interpreter was used.  Otalgia Location:  Right Behind ear:  No abnormality Quality:  Aching Severity:  Moderate Onset quality:  Gradual Duration:  4 days Timing:  Constant Progression:  Worsening Chronicity:  New Relieved by:  Nothing Worsened by:  Nothing Associated symptoms: headaches   Headache Pain location:  Generalized Worsened by:  Nothing Ineffective treatments:  None tried Associated symptoms: ear pain    Past Medical History:  Diagnosis Date   Cluster headaches    Frequent UTI    Gallstones    Ovarian cyst    Sinus drainage    Sinus headache    Wheezing without diagnosis of asthma    with exertion and high humidity    Patient Active Problem List   Diagnosis Date Noted   IBS (irritable bowel syndrome) 12/07/2015   Cholelithiasis 07/11/2015    Past Surgical History:  Procedure Laterality Date   CHOLECYSTECTOMY N/A 07/11/2015   Procedure: OPEN CHOLECYSTECTOMY;  Surgeon: Franky Macho, MD;  Location: [facility];  Service: General;  Laterality: N/A;   OOPHORECTOMY  1994   open with midline incision, 20+ cm cyst, multiple   TONSILLECTOMY     WISDOM TOOTH EXTRACTION  age 33    OB History   No obstetric history on file.      Home Medications    Prior to Admission medications   Medication Sig Start Date End Date Taking? Authorizing Provider  amoxicillin (AMOXIL) 500 MG capsule Take 1 capsule (500 mg total) by mouth 3 (three) times daily. 08/21/21  Yes Elson Areas, PA-C  promethazine (PHENERGAN) 25 MG tablet Take 1 tablet (25 mg total) by mouth every 6 (six) hours as needed for nausea or vomiting. 08/21/21  Yes Elson Areas, PA-C   acetaminophen (TYLENOL) 500 MG tablet Take 500-1,000 mg by mouth every 6 (six) hours as needed for moderate pain.    [provider]  azithromycin (ZITHROMAX) 250 MG tablet Take 1 tablet (250 mg total) by mouth daily. Take first 2 tablets together, then 1 every day until finished. 03/15/20   Avegno, Zachery Dakins, FNP  cetirizine (ZYRTEC) 10 MG tablet Take 10 mg by mouth daily.    [provider]  Charcoal Activated (ACTIVATED CHARCOAL PO) Take 1 tablet by mouth daily.    [provider]  fluticasone (FLONASE) 50 MCG/ACT nasal spray Place 1 spray into both nostrils daily for 14 days. 03/15/20 03/29/20  Avegno, Zachery Dakins, FNP  ibuprofen (ADVIL,MOTRIN) 200 MG tablet Take 400 mg by mouth every 6 (six) hours as needed for pain.    [provider]  naproxen sodium (ANAPROX) 220 MG tablet Take 220-440 mg by mouth daily as needed (cramping).    [provider]  norethindrone (MICRONOR,CAMILA,ERRIN) 0.35 MG tablet Take 1 tablet by mouth daily.    [provider]  ondansetron (ZOFRAN) 4 MG tablet TAKE 1 TABLET(4 MG) BY MOUTH EVERY 8 HOURS AS NEEDED FOR NAUSEA OR VOMITING 09/15/17   Tiffany Kocher, PA-C  ondansetron (ZOFRAN) 4 MG tablet TAKE 1 TABLET(4 MG) BY MOUTH EVERY 8 HOURS AS NEEDED FOR  NAUSEA OR VOMITING 09/20/17   Tiffany Kocher, PA-C  pantoprazole (PROTONIX) 40 MG tablet Take one tablet daily. 12/01/15   [provider]  polyethylene glycol (MIRALAX) packet Take 17 g by mouth daily. 01/14/15   Janne Napoleon, NP  pseudoephedrine-acetaminophen (TYLENOL SINUS) 30-500 MG TABS Take 1 tablet by mouth every 4 (four) hours as needed (Headache).    [provider]  traMADol (ULTRAM) 50 MG tablet Take by mouth every 6 (six) hours as needed.    [provider]  venlafaxine XR (EFFEXOR-XR) 75 MG 24 hr capsule Take 75 mg by mouth daily. 02/12/20   [provider]    Family History Family History  Problem Relation Age of Onset    Gallbladder disease Mother    Colon cancer Neg Hx     Social History Social History   Tobacco Use   Smoking status: Never   Smokeless tobacco: Never  Substance Use Topics   Alcohol use: Yes    Alcohol/week: 0.0 standard drinks    Comment: used to drink 1 beer a day, now just once a week at the most    Drug use: No     Allergies   Codeine and Lactose intolerance (gi)   Review of Systems Review of Systems  HENT:  Positive for ear pain.   Neurological:  Positive for headaches.  All other systems reviewed and are negative.   Physical Exam Triage Vital Signs ED Triage Vitals [08/21/21 1147]  Enc Vitals Group     BP (!) 125/93     Pulse Rate 91     Resp 20     Temp 98.6 F (37 C)     Temp src      SpO2 98 %     Weight      Height      Head Circumference      Peak Flow      Pain Score 6     Pain Loc      Pain Edu?      Excl. in GC?    No data found.  Updated Vital Signs BP (!) 125/93   Pulse 91   Temp 98.6 F (37 C)   Resp 20   LMP 08/18/2021   SpO2 98%   Visual Acuity Right Eye Distance:   Left Eye Distance:   Bilateral Distance:    Right Eye Near:   Left Eye Near:    Bilateral Near:     Physical Exam Vitals reviewed.  HENT:     Right Ear: Tympanic membrane is erythematous.     Left Ear: Tympanic membrane normal.  Cardiovascular:     Rate and Rhythm: Normal rate and regular rhythm.  Pulmonary:     Effort: Pulmonary effort is normal.  Neurological:     Mental Status: She is alert.  Psychiatric:        Mood and Affect: Mood normal.     UC Treatments / Results  Labs (all labs ordered are listed, but only abnormal results are displayed) Labs Reviewed - No data to display  EKG   Radiology No results found.  Procedures Procedures (including critical care time)  Medications Ordered in UC Medications - No data to display  Initial Impression / Assessment and Plan / UC Course  I have reviewed the triage vital signs and the  nursing notes.  Pertinent labs & imaging results that were available during my care of the patient were reviewed by me and considered  in my medical decision making (see chart for details).     MDM:  Pt given rx for amoxicillian and phenergan  Final Clinical Impressions(s) / UC Diagnoses   Final diagnoses:  Right otitis media, unspecified otitis media type     Discharge Instructions      Return if any problems.    ED Prescriptions     Medication Sig Dispense Auth. Provider   amoxicillin (AMOXIL) 500 MG capsule Take 1 capsule (500 mg total) by mouth 3 (three) times daily. 30 capsule Audrina Marten K, [place]   promethazine (PHENERGAN) 25 MG tablet Take 1 tablet (25 mg total) by mouth every 6 (six) hours as needed for nausea or vomiting. 20 tablet Elson Areas, [place]      PDMP not reviewed this encounter. An After Visit Summary was printed and given to the patient.    Elson Areas, [place] 08/25/21 1359

## 2021-09-20 ENCOUNTER — Encounter: Payer: Self-pay | Admitting: Advanced Practice Midwife

## 2021-09-20 ENCOUNTER — Ambulatory Visit: Payer: Managed Care, Other (non HMO) | Admitting: Advanced Practice Midwife

## 2021-09-20 ENCOUNTER — Other Ambulatory Visit: Payer: Self-pay

## 2021-09-20 VITALS — BP 134/96 | Ht 66.0 in | Wt 248.0 lb

## 2021-09-20 DIAGNOSIS — Z30011 Encounter for initial prescription of contraceptive pills: Secondary | ICD-10-CM | POA: Diagnosis not present

## 2021-09-20 DIAGNOSIS — Z309 Encounter for contraceptive management, unspecified: Secondary | ICD-10-CM | POA: Insufficient documentation

## 2021-09-20 DIAGNOSIS — R030 Elevated blood-pressure reading, without diagnosis of hypertension: Secondary | ICD-10-CM | POA: Diagnosis not present

## 2021-09-20 MED ORDER — TERCONAZOLE 0.4 % VA CREA: 1.0000 | TOPICAL_CREAM | Freq: Every day | VAGINAL | 3 refills | Status: DC

## 2021-09-20 MED ORDER — SLYND 4 MG PO TABS: 1.0000 | ORAL_TABLET | Freq: Every day | ORAL | 12 refills | Status: DC

## 2021-09-20 NOTE — Progress Notes (Signed)
GYN VISIT Patient name: Stacy Swanson MRN [id number]  Date of birth: Apr 20, 1981 Chief Complaint:   Discuss birth control  History of Present Illness:   Stacy Swanson is a 40 y.o. No obstetric history on file. Caucasian female being seen today for contraceptive management. Has been on norethindrone for years but stopped approx 1 month ago when she had some L calf pain, thinking it may be a clot. Has hx migranes/aura. Also with some mild range BP elevations this year. Would like to continue on POPs but is interested in the time-forgiveness aspect of Slynd. Declines Nex/IUD.     Patient's last menstrual period was 08/06/2021. The current method of family planning is none.  Last pap early 2020. Results were:  neg per pt report  Depression screen [facility] 2/9 09/20/2021  Decreased Interest 0  Down, Depressed, Hopeless 1  PHQ - 2 Score 1  Altered sleeping 3  Tired, decreased energy 3  Change in appetite 2  Feeling bad or failure about yourself  2  Trouble concentrating 2  Moving slowly or fidgety/restless 0  Suicidal thoughts 0  PHQ-9 Score 13     GAD 7 : Generalized Anxiety Score 09/20/2021  Nervous, Anxious, on Edge 1  Control/stop worrying 0  Worry too much - different things 1  Trouble relaxing 0  Restless 0  Easily annoyed or irritable 1  Afraid - awful might happen 0  Total GAD 7 Score 3     Review of Systems:   Pertinent items are noted in HPI Denies fever/chills, dizziness, headaches, visual disturbances, fatigue, shortness of breath, chest pain, abdominal pain, vomiting, abnormal vaginal discharge/itching/odor/irritation, problems with periods, bowel movements, urination, or intercourse unless otherwise stated above.  Pertinent History Reviewed:  Reviewed past medical,surgical, social, obstetrical and family history.  Reviewed problem list, medications and allergies. Physical Assessment:   Vitals:   09/20/21 1126  BP: (!) 134/96  Weight: 248 lb (112.5 kg)   Height: 5\' 6"  (1.676 m)  Body mass index is 40.03 kg/m.       Physical Examination:   General appearance: alert, well appearing, and in no distress  Mental status: alert, oriented to person, place, and time  Skin: warm & dry   Cardiovascular: normal heart rate noted  Respiratory: normal respiratory effort, no distress  Abdomen: soft, non-tender   Pelvic: examination not indicated  Extremities: no edema    No results found for this or any previous visit (from the past 24 hour(s)).  Assessment & Plan:  1) New start Slynd> sample pack x 1 given with rx  2) Frequent yeast> given Terazol for prn use  3) Elevated BP without HTN dx> PCP is following  Meds:  Meds ordered this encounter  Medications   Drospirenone (SLYND) 4 MG TABS    Sig: Take 1 tablet by mouth daily.    Dispense:  28 tablet    Refill:  12    Order Specific Question:   Supervising Provider    Answer:   Myna Hidalgo   terconazole (TERAZOL 7) 0.4 % vaginal cream    Sig: Place 1 applicator vaginally at bedtime.    Dispense:  45 g    Refill:  3    Order Specific Question:   Supervising Provider    Answer:   [[id number]] Myna Hidalgo    No orders of the defined types were placed in this encounter.   Return for Jan 2023 pap/phys.  Feb 2023 CNM 09/20/2021 12:12 PM

## 2021-10-16 ENCOUNTER — Encounter: Payer: Self-pay | Admitting: Allergy & Immunology

## 2021-10-16 ENCOUNTER — Other Ambulatory Visit: Payer: Self-pay

## 2021-10-16 ENCOUNTER — Telehealth: Payer: Self-pay | Admitting: Advanced Practice Midwife

## 2021-10-16 ENCOUNTER — Ambulatory Visit: Payer: Managed Care, Other (non HMO) | Admitting: Allergy & Immunology

## 2021-10-16 VITALS — BP 142/90 | HR 89 | Temp 98.0°F | Resp 20 | Ht 66.0 in | Wt 249.0 lb

## 2021-10-16 DIAGNOSIS — J302 Other seasonal allergic rhinitis: Secondary | ICD-10-CM

## 2021-10-16 DIAGNOSIS — J3089 Other allergic rhinitis: Secondary | ICD-10-CM

## 2021-10-16 DIAGNOSIS — K9049 Malabsorption due to intolerance, not elsewhere classified: Secondary | ICD-10-CM

## 2021-10-16 DIAGNOSIS — J4540 Moderate persistent asthma, uncomplicated: Secondary | ICD-10-CM | POA: Diagnosis not present

## 2021-10-16 NOTE — Progress Notes (Signed)
NEW PATIENT  Date of Service/Encounter:  10/16/21  Consult requested by: Benita Stabile, MD   Assessment:   Moderate persistent asthma, uncomplicated  Seasonal and perennial allergic rhinitis (grasses, ragweed, weeds, indoor molds, and dust mites)  Food intolerance  Plan/Recommendations:   1. Moderate persistent asthma, uncomplicated - Lung testing was decreased but did NOT improve with the albuterol treatment. - Start the prednisone pack provided.  - We are going to get some labs in case we need to start an injectable medicine in the future for your asthma. - We we will call you in 1 to 2 weeks with the results of the labs. - Stop Symbicort and start Breztri 2 puffs twice daily instead (this contains 3 medicines to help your breathing compared to the 2 medicines of Symbicort) - Spacer teaching reviewed. - Daily controller medication(s): Breztri 2 puffs twice daily with spacer - Prior to physical activity: albuterol 2 puffs 10-15 minutes before physical activity. - Rescue medications: albuterol 4 puffs every 4-6 hours as needed - Asthma control goals:  * Full participation in all desired activities (may need albuterol before activity) * Albuterol use two time or less a week on average (not counting use with activity) * Cough interfering with sleep two time or less a month * Oral steroids no more than once a year * No hospitalizations  2. Seasonal and perennial allergic rhinitis - with overlying pruritis (itchiness) - Testing today showed: grasses, ragweed, weeds, indoor molds, and dust mites. - Copy of test results provided.  - Avoidance measures provided. - Continue with: Zyrtec (cetirizine) 10 mg twice daily (alternate antihistamines every 1-2 months to improve their effectiveness) - Start taking: Pepcid (famotidine) 20mg  twice daily  - You can use an extra dose of the antihistamine, if needed, for breakthrough symptoms.  - Consider nasal saline rinses 1-2 times daily to  remove allergens from the nasal cavities as well as help with mucous clearance (this is especially helpful to do before the nasal sprays are given) - Consider allergy shots as a means of long-term control. - Allergy shots "re-train" and "reset" the immune system to ignore environmental allergens and decrease the resulting immune response to those allergens (sneezing, itchy watery eyes, runny nose, nasal congestion, etc).    - Allergy shots improve symptoms in 75-85% of patients.  - We can discuss more at the next appointment if the medications are not working for you.  3. 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 - We are going to get blood work to confirm this.  - Otherwise this just might be lactose intolerance.   4. Return in about 4 weeks (around 11/13/2021).    Subjective:   Stacy Swanson is a 40 y.o. female presenting today for evaluation of  Chief Complaint  Patient presents with   Asthma   Allergic Rhinitis     Stacy Swanson has a history of the following: Patient Active Problem List   Diagnosis Date Noted   Elevated BP without diagnosis of hypertension 09/20/2021   Contraception management 09/20/2021   IBS (irritable bowel syndrome) 12/07/2015   Cholelithiasis 07/11/2015    History obtained from: chart review and patient.  Stacy Swanson was referred by Benita Stabile, MD.     Stacy Swanson is a 40 y.o. female presenting for an evaluation of environmental allergies and asthma .  Asthma/Respiratory Symptom History: She does have a bit of a smoke allergy more so than the pipes. When she  was younger she had a bad URI and sehe started wheezing. She was placed on an inhaler for one year that expired before she could get the refill. Her insurance expired for these as wlel.  She is currently on Symbicort two puffs BID. She started that a couple of motnhs ago.   Allergic Rhinitis Symptom History: She thinks that she has pet allergies. She has started  using a spray for furniture which has helped. She recently had a stray cat adopt her. She has had itching all over her body especially when she is around her friends' animals. She does have a bit of a smoke allergy more so than the pipes.   She answers phones for companies. Her boyfriend works at [employer].  Otherwise, there is no history of other atopic diseases, including food allergies, drug allergies, stinging insect allergies, eczema, urticaria, or contact dermatitis. There is no significant infectious history. Vaccinations are up to date.    Past Medical History: Patient Active Problem List   Diagnosis Date Noted   Elevated BP without diagnosis of hypertension 09/20/2021   Contraception management 09/20/2021   IBS (irritable bowel syndrome) 12/07/2015   Cholelithiasis 07/11/2015    Medication List:  Allergies as of 10/16/2021       Reactions   Codeine Other (See Comments)   Gave patient an ulcer.    Lactose Intolerance (gi) Other (See Comments)   G.I. Upset.         Medication List        Accurate as of October 16, 2021 11:59 PM. If you have any questions, ask your nurse or doctor.          STOP taking these medications    amoxicillin-clavulanate 875-125 MG tablet Commonly known as: AUGMENTIN Stopped by: Alfonse Spruce, MD   diphenoxylate-atropine 2.5-0.025 MG tablet Commonly known as: LOMOTIL Stopped by: Alfonse Spruce, MD       TAKE these medications    acetaminophen 500 MG tablet Commonly known as: TYLENOL Take 500-1,000 mg by mouth every 6 (six) hours as needed for moderate pain.   cetirizine 10 MG tablet Commonly known as: ZYRTEC Take 10 mg by mouth daily.   desonide 0.05 % cream Commonly known as: DESOWEN Apply topically.   ibuprofen 200 MG tablet Commonly known as: ADVIL Take 400 mg by mouth every 6 (six) hours as needed for pain.   naproxen sodium 220 MG tablet Commonly known as: ALEVE Take 220-440 mg by mouth daily  as needed (cramping).   ondansetron 4 MG tablet Commonly known as: ZOFRAN TAKE 1 TABLET(4 MG) BY MOUTH EVERY 8 HOURS AS NEEDED FOR NAUSEA OR VOMITING   polyethylene glycol 17 g packet Commonly known as: MiraLax Take 17 g by mouth daily.   promethazine 25 MG tablet Commonly known as: PHENERGAN Take 1 tablet (25 mg total) by mouth every 6 (six) hours as needed for nausea or vomiting.   pseudoephedrine-acetaminophen 30-500 MG Tabs tablet Commonly known as: TYLENOL SINUS Take 1 tablet by mouth every 4 (four) hours as needed (Headache).   Slynd 4 MG Tabs Generic drug: Drospirenone Take 1 tablet by mouth daily.   Symbicort 80-4.5 MCG/ACT inhaler Generic drug: budesonide-formoterol SMARTSIG:2 Puff(s) By Mouth Twice Daily   terconazole 0.4 % vaginal cream Commonly known as: TERAZOL 7 Place 1 applicator vaginally at bedtime.   venlafaxine XR 75 MG 24 hr capsule Commonly known as: EFFEXOR-XR Take 75 mg by mouth daily.        Birth  History: non-contributory  Developmental History: non-contributory  Past Surgical History: Past Surgical History:  Procedure Laterality Date   CHOLECYSTECTOMY N/A 07/11/2015   Procedure: OPEN CHOLECYSTECTOMY;  Surgeon: Franky Macho, MD;  Location: [facility];  Service: General;  Laterality: N/A;   OOPHORECTOMY  1994   open with midline incision, 20+ cm cyst, multiple   TONSILLECTOMY     WISDOM TOOTH EXTRACTION  age 59     Family History: Family History  Problem Relation Age of Onset   Gallbladder disease Mother    Colon cancer Neg Hx      Social History: Raetta lives at home with her boyfriend.  She lives in an apartment of unknown age.  There is carpeting throughout the home.  She has a heat pump for heating and central cooling.  There are cats inside of the home and multiple outdoor animals outside of the home.  There are no dust mite covers on the pillows, but she does have them on the bedding.  There is tobacco exposure in the home.  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  She has done this for 9 years.  She is exposed to fumes, chemicals, and dust.   Review of Systems  Constitutional: Negative.  Negative for chills, fever, malaise/fatigue and weight loss.  HENT:  Positive for congestion and sinus pain. Negative for ear discharge and ear pain.   Eyes:  Negative for pain, discharge and redness.  Respiratory:  Negative for cough, sputum production, shortness of breath and wheezing.   Cardiovascular: Negative.  Negative for chest pain and palpitations.  Gastrointestinal:  Negative for abdominal pain, constipation, diarrhea, heartburn, nausea and vomiting.  Skin: Negative.  Negative for itching and rash.  Neurological:  Negative for dizziness and headaches.  Endo/Heme/Allergies:  Positive for environmental allergies. Does not bruise/bleed easily.      Objective:   Blood pressure (!) 142/90, pulse 89, temperature 98 F (36.7 C), resp. rate 20, height  (1.676 m), weight 249 lb (112.9 kg), SpO2 93 %. Body mass index is 40.19 kg/m.   Physical Exam:   Physical Exam Vitals reviewed.  Constitutional:      Appearance: She is well-developed.     Comments: Very talkative.  HENT:     Head: Normocephalic and atraumatic.     Right Ear: Tympanic membrane, ear canal and external ear normal. No drainage, swelling or tenderness. Tympanic membrane is not injected, scarred, erythematous, retracted or bulging.     Left Ear: Tympanic membrane, ear canal and external ear normal. No drainage, swelling or tenderness. Tympanic membrane is not injected, scarred, erythematous, retracted or bulging.     Nose: No nasal deformity, septal deviation, mucosal edema or rhinorrhea.     Right Sinus: No maxillary sinus tenderness or frontal sinus tenderness.     Left Sinus: No maxillary sinus tenderness or frontal sinus tenderness.     Mouth/Throat:     Mouth: Mucous membranes are not pale and not  dry.     Pharynx: Uvula midline.  Eyes:     General:        Right eye: No discharge.        Left eye: No discharge.     Conjunctiva/sclera: Conjunctivae normal.     Right eye: Right conjunctiva is not injected. No chemosis.    Left eye: Left conjunctiva is not injected. No chemosis.    Pupils: Pupils are equal, round, and reactive to light.  Cardiovascular:  Rate and Rhythm: Normal rate and regular rhythm.     Heart sounds: Normal heart sounds.  Pulmonary:     Effort: Pulmonary effort is normal. No tachypnea, accessory muscle usage or respiratory distress.     Breath sounds: Normal breath sounds. No wheezing, rhonchi or rales.  Chest:     Chest wall: No tenderness.  Abdominal:     Tenderness: There is no abdominal tenderness. There is no guarding or rebound.  Lymphadenopathy:     Head:     Right side of head: No submandibular, tonsillar or occipital adenopathy.     Left side of head: No submandibular, tonsillar or occipital adenopathy.     Cervical: No cervical adenopathy.  Skin:    Coloration: Skin is not pale.     Findings: No abrasion, erythema, petechiae or rash. Rash is not papular, urticarial or vesicular.  Neurological:     Mental Status: She is alert.  Psychiatric:        Behavior: Behavior is cooperative.     Diagnostic studies:     Allergy Studies:     Airborne Adult Perc - 10/16/21 1536     Time Antigen Placed 1500    Allergen Manufacturer Waynette Buttery    Location Back    Number of Test 61    1. Control-Buffer 50% Glycerol Negative    2. Control-Histamine 1 mg/ml 2+    3. Albumin saline Negative    4. Bahia Negative    5. French Southern Territories Negative    6. Johnson Negative    7. Kentucky Blue Negative    8. Meadow Fescue Negative    9. Perennial Rye Negative    10. Sweet Vernal Negative    11. Timothy Negative    12. Cocklebur Negative    13. Burweed Marshelder Negative    14. Ragweed, short Negative    15. Ragweed, Giant Negative    16. Plantain,  English  Negative    17. Lamb's Quarters Negative    18. Sheep Sorrell Negative    19. Rough Pigweed Negative    20. Marsh Elder, Rough Negative    21. Mugwort, Common Negative    22. Ash mix Negative    23. Birch mix Negative    24. Beech American Negative    25. Box, Elder Negative    26. Cedar, red Negative    27. Cottonwood, Guinea-Bissau Negative    28. Elm mix Negative    29. Hickory Negative    30. Maple mix Negative    31. Oak, [place] mix Negative    32. Pecan Pollen Negative    33. Pine mix Negative    34. Sycamore Eastern Negative    35. Walnut, Black Pollen Negative    36. Alternaria alternata Negative    37. Cladosporium Herbarum Negative    38. Aspergillus mix Negative    39. Penicillium mix Negative    40. Bipolaris sorokiniana (Helminthosporium) Negative    41. Drechslera spicifera (Curvularia) Negative    42. Mucor plumbeus Negative    43. Fusarium moniliforme Negative    44. Aureobasidium pullulans (pullulara) Negative    45. Rhizopus oryzae Negative    46. Botrytis cinera Negative    47. Epicoccum nigrum Negative    48. Phoma betae Negative    49. Candida Albicans Negative    50. Trichophyton mentagrophytes Negative    51. Mite, D Farinae  5,000 AU/ml Negative    52. Mite, D Pteronyssinus  5,000 AU/ml Negative  53. Cat Hair 10,000 BAU/ml Negative    54.  Dog Epithelia Negative    55. Mixed Feathers Negative    56. Horse Epithelia Negative    57. Cockroach, German Negative    58. Mouse Negative    59. Tobacco Leaf Negative    Other --   Casein: NEG   Other --   Cow's milk: NEG            Intradermal - 10/16/21 1536     Time Antigen Placed 1515    Allergen Manufacturer Waynette Buttery    Location Arm    Number of Test 15    Control Negative    French Southern Territories Negative    Johnson 2+    7 Grass Negative    Ragweed mix 2+    Weed mix 2+    Tree mix Negative    Mold 1 Negative    Mold 2 3+    Mold 3 Negative    Mold 4 Negative    Cat Negative    Dog Negative     Cockroach Negative    Mite mix 1+             Allergy testing results were read and interpreted by myself, documented by clinical staff.         Malachi Bonds, MD [facility]

## 2021-10-16 NOTE — Patient Instructions (Addendum)
1. Moderate persistent asthma, uncomplicated - Lung testing was decreased but did NOT improve with the albuterol treatment. - Start the prednisone pack provided.  - We are going to get some labs in case we need to start an injectable medicine in the future for your asthma. - We we will call you in 1 to 2 weeks with the results of the labs. - Stop Symbicort and start Breztri 2 puffs twice daily instead (this contains 3 medicines to help your breathing compared to the 2 medicines of Symbicort) - Spacer teaching reviewed. - Daily controller medication(s): Breztri 2 puffs twice daily with spacer - Prior to physical activity: albuterol 2 puffs 10-15 minutes before physical activity. - Rescue medications: albuterol 4 puffs every 4-6 hours as needed - Asthma control goals:  * Full participation in all desired activities (may need albuterol before activity) * Albuterol use two time or less a week on average (not counting use with activity) * Cough interfering with sleep two time or less a month * Oral steroids no more than once a year * No hospitalizations  2. Seasonal and perennial allergic rhinitis - with overlying pruritis (itchiness) - Testing today showed: grasses, ragweed, weeds, indoor molds, and dust mites. - Copy of test results provided.  - Avoidance measures provided. - Continue with: Zyrtec (cetirizine) 10 mg twice daily (alternate antihistamines every 1-2 months to improve their effectiveness) - Start taking: Pepcid (famotidine) 20mg  twice daily  - You can use an extra dose of the antihistamine, if needed, for breakthrough symptoms.  - Consider nasal saline rinses 1-2 times daily to remove allergens from the nasal cavities as well as help with mucous clearance (this is especially helpful to do before the nasal sprays are given) - Consider allergy shots as a means of long-term control. - Allergy shots "re-train" and "reset" the immune system to ignore environmental allergens and  decrease the resulting immune response to those allergens (sneezing, itchy watery eyes, runny nose, nasal congestion, etc).    - Allergy shots improve symptoms in 75-85% of patients.  - We can discuss more at the next appointment if the medications are not working for you.  3. 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 - We are going to get blood work to confirm this.  - Otherwise this just might be lactose intolerance.   4. Return in about 4 weeks (around 11/13/2021).    Please inform 11/15/2021 of any Emergency Department visits, hospitalizations, or changes in symptoms. Call us before going to the ED for breathing or allergy symptoms since we might be able to fit you in for a sick visit. Feel free to contact us anytime with any questions, problems, or concerns.  It was a pleasure to meet you today!  Websites that have reliable patient information: 1. American Academy of Asthma, Allergy, and Immunology: www.aaaai.org 2. Food Allergy Research and Education (FARE): foodallergy.org 3. Mothers of Asthmatics: http://www.asthmacommunitynetwork.org 4. American College of Allergy, Asthma, and Immunology: www.acaai.org   COVID-19 Vaccine Information can be found at: [place] For questions related to vaccine distribution or appointments, please email [email or web address] or call [phone].   We realize that you might be concerned about having an allergic reaction to the COVID19 vaccines. To help with that concern, WE ARE OFFERING THE COVID19 VACCINES IN OUR OFFICE! Ask the front desk for dates!     "Like" [phone] on Facebook and Instagram for our latest updates!      A healthy democracy works best when [place]  voters participate! Make sure you are registered to vote! If you have moved or changed any of your contact information, you will need to get this updated before voting!  In some cases, you MAY be able to  register to vote online: [email or web address]      Airborne Adult Perc - 10/16/21 1536     Time Antigen Placed 1500    Allergen Manufacturer Waynette Buttery    Location Back    Number of Test 61    1. Control-Buffer 50% Glycerol Negative    2. Control-Histamine 1 mg/ml 2+    3. Albumin saline Negative    4. Bahia Negative    5. [place] Negative    6. Johnson Negative    7. Kentucky Blue Negative    8. Meadow Fescue Negative    9. Perennial Rye Negative    10. Sweet Vernal Negative    11. Timothy Negative    12. Cocklebur Negative    13. Burweed Marshelder Negative    14. Ragweed, short Negative    15. Ragweed, Giant Negative    16. Plantain,  English Negative    17. Lamb's Quarters Negative    18. Sheep Sorrell Negative    19. Rough Pigweed Negative    20. Marsh Elder, Rough Negative    21. Mugwort, Common Negative    22. Ash mix Negative    23. Birch mix Negative    24. Beech American Negative    25. Box, Elder Negative    26. Cedar, red Negative    27. Cottonwood, Guinea[place]iss[place] Negative    28. Elm mix Negative    29. Hickory Negative    30. Maple mix Negative    31. Oak, [place] mix Negative    32. Pecan Pollen Negative    33. Pine mix Negative    34. Sycamore Eastern Negative    35. Walnut, Black Pollen Negative    36. Alternaria alternata Negative    37. Cladosporium Herbarum Negative    38. Aspergillus mix Negative    39. Penicillium mix Negative    40. Bipolaris sorokiniana (Helminthosporium) Negative    41. Drechslera spicifera (Curvularia) Negative    42. Mucor plumbeus Negative    43. Fusarium moniliforme Negative    44. Aureobasidium pullulans (pullulara) Negative    45. Rhizopus oryzae Negative    46. Botrytis cinera Negative    47. Epicoccum nigrum Negative    48. Phoma betae Negative    49. Candida Albicans Negative    50. Trichophyton mentagrophytes Negative    51. Mite, D Farinae  5,000 AU/ml Negative    52. Mite, D  Pteronyssinus  5,000 AU/ml Negative    53. Cat Hair 10,000 BAU/ml Negative    54.  Dog Epithelia Negative    55. Mixed Feathers Negative    56. Horse Epithelia Negative    57. Cockroach, German Negative    58. Mouse Negative    59. Tobacco Leaf Negative    Other --   Casein: NEG   Other --   Cow's milk: NEG            Intradermal - 10/16/21 1536     Time Antigen Placed 1515    Allergen Manufacturer Greer    Location Arm    Number of Test 15    Control Negative    [place] Negative    Johnson 2+    7 Grass Negative    Ragweed mix 2+    Weed mix 2+  Tree mix Negative    Mold 1 Negative    Mold 2 3+    Mold 3 Negative    Mold 4 Negative    Cat Negative    Dog Negative    Cockroach Negative    Mite mix 1+             Reducing Pollen Exposure  The American Academy of Allergy, Asthma and Immunology suggests the following steps to reduce your exposure to pollen during allergy seasons.    Do not hang sheets or clothing out to dry; pollen may collect on these items. Do not mow lawns or spend time around freshly cut grass; mowing stirs up pollen. Keep windows closed at night.  Keep car windows closed while driving. Minimize morning activities outdoors, a time when pollen counts are usually at their highest. Stay indoors as much as possible when pollen counts or humidity is high and on windy days when pollen tends to remain in the air longer. Use air conditioning when possible.  Many air conditioners have filters that trap the pollen spores. Use a HEPA room air filter to remove pollen form the indoor air you breathe.  Control of Mold Allergen   Mold and fungi can grow on a variety of surfaces provided certain temperature and moisture conditions exist.  Outdoor molds grow on plants, decaying vegetation and soil.  The major outdoor mold, Alternaria and Cladosporium, are found in very high numbers during hot and dry conditions.  Generally, a late Summer - Fall peak is  seen for common outdoor fungal spores.  Rain will temporarily lower outdoor mold spore count, but counts rise rapidly when the rainy period ends.  The most important indoor molds are Aspergillus and Penicillium.  Dark, humid and poorly ventilated basements are ideal sites for mold growth.  The next most common sites of mold growth are the bathroom and the kitchen.   Indoor (Perennial) Mold Control   Positive indoor molds via skin testing: Aspergillus and Penicillium  Maintain humidity below 50%. Clean washable surfaces with 5% bleach solution. Remove sources e.g. contaminated carpets.    Control of Dust Mite Allergen    Dust mites play a major role in allergic asthma and rhinitis.  They occur in environments with high humidity wherever human skin is found.  Dust mites absorb humidity from the atmosphere (ie, they do not drink) and feed on organic matter (including shed human and animal skin).  Dust mites are a microscopic type of insect that you cannot see with the naked eye.  High levels of dust mites have been detected from mattresses, pillows, carpets, upholstered furniture, bed covers, clothes, soft toys and any woven material.  The principal allergen of the dust mite is found in its feces.  A gram of dust may contain 1,000 mites and 250,000 fecal particles.  Mite antigen is easily measured in the air during house cleaning activities.  Dust mites do not bite and do not cause harm to humans, other than by triggering allergies/asthma.    Ways to decrease your exposure to dust mites in your home:  Encase mattresses, box springs and pillows with a mite-impermeable barrier or cover   Wash sheets, blankets and drapes weekly in hot water (130 F) with detergent and dry them in a dryer on the hot setting.  Have the room cleaned frequently with a vacuum cleaner and a damp dust-mop.  For carpeting or rugs, vacuuming with a vacuum cleaner equipped with a high-efficiency particulate air (HEPA)  filter.   The dust mite allergic individual should not be in a room which is being cleaned and should wait 1 hour after cleaning before going into the room. Do not sleep on upholstered furniture (eg, couches).   If possible removing carpeting, upholstered furniture and drapery from the home is ideal.  Horizontal blinds should be eliminated in the rooms where the person spends the most time (bedroom, study, television room).  Washable vinyl, roller-type shades are optimal. Remove all non-washable stuffed toys from the bedroom.  Wash stuffed toys weekly like sheets and blankets above.   Reduce indoor humidity to less than 50%.  Inexpensive humidity monitors can be purchased at most hardware stores.  Do not use a humidifier as can make the problem worse and are not recommended.

## 2021-10-16 NOTE — Telephone Encounter (Signed)
Patient wanted to see if we had any refills on her birth control (SLYND). She called pharmacy and they won't have any in before she runs out.

## 2021-10-17 MED ORDER — ALBUTEROL SULFATE HFA 108 (90 BASE) MCG/ACT IN AERS: 2.0000 | INHALATION_SPRAY | Freq: Four times a day (QID) | RESPIRATORY_TRACT | 1 refills | Status: DC | PRN

## 2021-10-17 MED ORDER — PREDNISONE 10 MG PO TABS: ORAL_TABLET | ORAL | 0 refills | Status: DC

## 2021-10-17 MED ORDER — BREZTRI AEROSPHERE 160-9-4.8 MCG/ACT IN AERO: 2.0000 | INHALATION_SPRAY | Freq: Two times a day (BID) | RESPIRATORY_TRACT | 5 refills | Status: DC

## 2021-10-17 NOTE — Addendum Note (Signed)
Addended by: Dollene Cleveland R on: 10/17/2021 07:41 PM   Modules accepted: Orders

## 2021-10-24 LAB — CBC WITH DIFFERENTIAL/PLATELET
Basophils Absolute: 0 10*3/uL (ref 0.0–0.2)
Basos: 1 %
EOS (ABSOLUTE): 0 10*3/uL (ref 0.0–0.4)
Eos: 1 %
Hematocrit: 41.4 % (ref 34.0–46.6)
Hemoglobin: 14.2 g/dL (ref 11.1–15.9)
Immature Grans (Abs): 0 10*3/uL (ref 0.0–0.1)
Immature Granulocytes: 0 %
Lymphocytes Absolute: 1.5 10*3/uL (ref 0.7–3.1)
Lymphs: 25 %
MCH: 30.1 pg (ref 26.6–33.0)
MCHC: 34.3 g/dL (ref 31.5–35.7)
MCV: 88 fL (ref 79–97)
Monocytes Absolute: 0.6 10*3/uL (ref 0.1–0.9)
Monocytes: 10 %
Neutrophils Absolute: 4 10*3/uL (ref 1.4–7.0)
Neutrophils: 63 %
Platelets: 380 10*3/uL (ref 150–450)
RBC: 4.71 x10E6/uL (ref 3.77–5.28)
RDW: 12.6 % (ref 11.7–15.4)
WBC: 6.2 10*3/uL (ref 3.4–10.8)

## 2021-10-24 LAB — IGE MILK W/ COMPONENT REFLEX: F002-IgE Milk: <0.1 kU/L

## 2021-10-24 LAB — IGE: IgE (Immunoglobulin E), Serum: 2 IU/mL — ABNORMAL LOW (ref 6–495)

## 2021-11-04 ENCOUNTER — Telehealth: Payer: Self-pay | Admitting: *Deleted

## 2021-11-04 NOTE — Telephone Encounter (Signed)
-----   Message from Alfonse Spruce, MD sent at 10/30/2021 12:39 PM EST ----- MyChart message sent. 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   Malachi Bonds, MD [facility]

## 2021-11-04 NOTE — Telephone Encounter (Signed)
L/m for patient to contact me to discuss Tezspire for her asthma. Approval done

## 2021-11-06 NOTE — Telephone Encounter (Signed)
L/m for patient to contact me  

## 2021-11-11 NOTE — Telephone Encounter (Signed)
Patient never returned calls from me  can reach out to me in future if she decides to start therapy

## 2021-11-14 ENCOUNTER — Telehealth: Payer: Self-pay | Admitting: *Deleted

## 2021-11-14 NOTE — Patient Instructions (Addendum)
1. Moderate persistent asthma, uncomplicated -Consider Tezspire.  We will wait to see if you are able to get assistance. - Daily controller medication(s): Breztri 2 puffs twice daily with spacer - Prior to physical activity: albuterol 2 puffs 10-15 minutes before physical activity. - Rescue medications: albuterol 4 puffs every 4-6 hours as needed - Asthma control goals:  * Full participation in all desired activities (may need albuterol before activity) * Albuterol use two time or less a week on average (not counting use with activity) * Cough interfering with sleep two time or less a month * Oral steroids no more than once a year * No hospitalizations  2. Seasonal and perennial allergic rhinitis - with overlying pruritis (itchiness)(skin testing positive EX:HBZJIRC, ragweed, weeds, indoor molds, and dust mites). - Continue with: Zyrtec (cetirizine) 10 mg twice daily (alternate antihistamines every 1-2 months to improve their effectiveness)Recommend stopping Benadryl when your finances get better due to it causing you to be drowsy - Consider nasal saline rinses 1-2 times daily to remove allergens from the nasal cavities as well as help with mucous clearance (this is especially helpful to do before the nasal sprays are given) - Consider allergy shots as a means of long-term control. - Allergy shots "re-train" and "reset" the immune system to ignore environmental allergens and decrease the resulting immune response to those allergens (sneezing, itchy watery eyes, runny nose, nasal congestion, etc).    - Allergy shots improve symptoms in 75-85% of patients.  - We can discuss more at the next appointment if the medications are not working for you.  3. 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  IgE to milk negative  - You are ok to avoid,but should feel safe to introduce if interested   Please let us know if this treatment plan is not working well for you. Schedule a follow up  appointment in 2 months  Reducing Pollen Exposure  The American Academy of Allergy, Asthma and Immunology suggests the following steps to reduce your exposure to pollen during allergy seasons.    Do not hang sheets or clothing out to dry; pollen may collect on these items. Do not mow lawns or spend time around freshly cut grass; mowing stirs up pollen. Keep windows closed at night.  Keep car windows closed while driving. Minimize morning activities outdoors, a time when pollen counts are usually at their highest. Stay indoors as much as possible when pollen counts or humidity is high and on windy days when pollen tends to remain in the air longer. Use air conditioning when possible.  Many air conditioners have filters that trap the pollen spores. Use a HEPA room air filter to remove pollen form the indoor air you breathe.  Control of Mold Allergen   Mold and fungi can grow on a variety of surfaces provided certain temperature and moisture conditions exist.  Outdoor molds grow on plants, decaying vegetation and soil.  The major outdoor mold, Alternaria and Cladosporium, are found in very high numbers during hot and dry conditions.  Generally, a late Summer - Fall peak is seen for common outdoor fungal spores.  Rain will temporarily lower outdoor mold spore count, but counts rise rapidly when the rainy period ends.  The most important indoor molds are Aspergillus and Penicillium.  Dark, humid and poorly ventilated basements are ideal sites for mold growth.  The next most common sites of mold growth are the bathroom and the kitchen.   Indoor (Perennial) Mold Control  Positive indoor molds via skin testing: Aspergillus and Penicillium  Maintain humidity below 50%. Clean washable surfaces with 5% bleach solution. Remove sources e.g. contaminated carpets.    Control of Dust Mite Allergen    Dust mites play a major role in allergic asthma and rhinitis.  They occur in environments with high  humidity wherever human skin is found.  Dust mites absorb humidity from the atmosphere (ie, they do not drink) and feed on organic matter (including shed human and animal skin).  Dust mites are a microscopic type of insect that you cannot see with the naked eye.  High levels of dust mites have been detected from mattresses, pillows, carpets, upholstered furniture, bed covers, clothes, soft toys and any woven material.  The principal allergen of the dust mite is found in its feces.  A gram of dust may contain 1,000 mites and 250,000 fecal particles.  Mite antigen is easily measured in the air during house cleaning activities.  Dust mites do not bite and do not cause harm to humans, other than by triggering allergies/asthma.    Ways to decrease your exposure to dust mites in your home:  Encase mattresses, box springs and pillows with a mite-impermeable barrier or cover   Wash sheets, blankets and drapes weekly in hot water (130 F) with detergent and dry them in a dryer on the hot setting.  Have the room cleaned frequently with a vacuum cleaner and a damp dust-mop.  For carpeting or rugs, vacuuming with a vacuum cleaner equipped with a high-efficiency particulate air (HEPA) filter.  The dust mite allergic individual should not be in a room which is being cleaned and should wait 1 hour after cleaning before going into the room. Do not sleep on upholstered furniture (eg, couches).   If possible removing carpeting, upholstered furniture and drapery from the home is ideal.  Horizontal blinds should be eliminated in the rooms where the person spends the most time (bedroom, study, television room).  Washable vinyl, roller-type shades are optimal. Remove all non-washable stuffed toys from the bedroom.  Wash stuffed toys weekly like sheets and blankets above.   Reduce indoor humidity to less than 50%.  Inexpensive humidity monitors can be purchased at most hardware stores.  Do not use a humidifier as can make the  problem worse and are not recommended.

## 2021-11-14 NOTE — Telephone Encounter (Signed)
Patient found her messages and called me back. We discussed Tezspire approval, copay card and submit to [facility]. Will be in touch once delivery set to advise appt to start therapy

## 2021-11-14 NOTE — Telephone Encounter (Signed)
Thanks, Tammy!   Martino Tompson, MD [facility]  

## 2021-11-15 ENCOUNTER — Encounter: Payer: Self-pay | Admitting: Family

## 2021-11-15 ENCOUNTER — Ambulatory Visit: Payer: Managed Care, Other (non HMO) | Admitting: Family

## 2021-11-15 ENCOUNTER — Other Ambulatory Visit: Payer: Self-pay

## 2021-11-15 VITALS — BP 122/80 | HR 96 | Temp 97.1°F | Resp 16

## 2021-11-15 DIAGNOSIS — J3089 Other allergic rhinitis: Secondary | ICD-10-CM

## 2021-11-15 DIAGNOSIS — J4540 Moderate persistent asthma, uncomplicated: Secondary | ICD-10-CM | POA: Diagnosis not present

## 2021-11-15 DIAGNOSIS — K9049 Malabsorption due to intolerance, not elsewhere classified: Secondary | ICD-10-CM

## 2021-11-15 DIAGNOSIS — J302 Other seasonal allergic rhinitis: Secondary | ICD-10-CM | POA: Diagnosis not present

## 2021-11-15 NOTE — Progress Notes (Signed)
[address] [place] [address] Dept: [phone]  FOLLOW UP NOTE  Patient ID: Stacy Swanson, female    DOB: 01/26/1981  Age: 40 y.o. MRN: [id number] Date of Office Visit: 11/15/2021  Assessment  Chief Complaint: Follow-up  HPI Stacy Swanson is a 40 year old female who presents today for follow-up of moderate persistent asthma, seasonal and perennial allergic rhinitis, and food intolerance.  She was last seen on October 16, 2021 by Dr. Dellis Anes.  Since her last office visit she denies any new diagnoses or surgeries but reports that she is going through a break-up with her boyfriend of 7 years.  Moderate persistent asthma is reported as better since starting Breztri 2 puffs twice a day with spacer.  She does mention still though that she has room for improvement.  She reports coughing at times, shortness of breath with exertion, tightness in her chest with a lot of exertion, and a little bit of wheezing that she cannot hear but she can feel.  Since her last office visit she has not required any other systemic steroids or made any trips to the emergency room or urgent care due to breathing problems.  She uses her albuterol prior to exercise but not any other time.  She is interested in starting Tezspire and mentions that she spoke with Tammy yesterday and has filled out the paperwork for assistance.  Seasonal and perennial allergic rhinitis is reported as moderately controlled with Benadryl once a day.  She is not taking Benadryl twice a day because it makes her too sleepy.  She reports that she was previously doing Zyrtec 10 mg twice a day but switched to Benadryl to improve the medication effectiveness.  She did not have money in the budget to buy a different antihistamine and she already had Benadryl at the house.  She also did not start Pepcid twice a day due to not having money.  She reports postnasal drip and is uncertain about rhinorrhea and nasal congestion  due to all the crying from breaking up with her boyfriend.  She mentions that since she found a spray on line that locks the allergies, she is no longer having itchiness.  As long as she uses this spray once a week it helps.  She mentions that she feels that the apartment she is and has mold and will cause her eyes to be itchy but the itchiness will eventually go away.  She mentions that if she drinks whole milk she has gas and bloating.  With lactose-free milk she has bloating and running to the bathroom.  Depending on how much cheese she she eats she sometimes will get bloating and have gas.  She is able to eat yogurt without any problems.  She can eat milk in baked goods without any problems.  She had questions about her lab work from her last office visit and wonders if she needs to be worried about her decreased IgE.  She mentions that she gets weird infections.  She denies having any sinus infections or skin infections since her last office visit.  Instructed her that we would keep track of any infection she has an we will get blood work if necessary in the future.   Drug Allergies:  Allergies  Allergen Reactions   Codeine Other (See Comments)    Gave patient an ulcer.    Lactose Intolerance (Gi) Other (See Comments)    G.I. Upset.     Review of Systems: Review of Systems  Constitutional:  Negative for chills and fever.  HENT:         Reports post nasal rip and is unsure about rhinorrhea and nasal congestion  Eyes:        Reports occasional itchy eyes  Respiratory:  Positive for cough, shortness of breath and wheezing.   Cardiovascular:  Positive for palpitations. Negative for chest pain.       Reports heart fluttering/pressure with exercise.  She reports that she has mentioned this with her PCP and they wanted her to do a stress test but she did not due to her insurance at the time.  Recommended scheduling an appointment with her primary care physician to discuss this.   Gastrointestinal:        Reports off-and-on reflux symptoms for which she will take Tums.  She reports she is not having as many problems since she had her gallbladder removed.  Genitourinary:  Negative for frequency.  Skin:  Negative for itching and rash.  Neurological:  Positive for headaches.  Endo/Heme/Allergies:  Positive for environmental allergies.    Physical Exam: BP 122/80 (BP Location: Left Arm, Patient Position: Sitting, Cuff Size: Normal)    Pulse 96    Temp (!) 97.1 F (36.2 C) (Temporal)    Resp 16    SpO2 99%    Physical Exam Constitutional:      Comments: Tearful  HENT:     Head: Normocephalic and atraumatic.     Comments: Pharynx normal, eyes normal, ears normal, nose: Bilateral lower turbinates mildly edematous and slightly erythematous with no drainage noted    Right Ear: Tympanic membrane, ear canal and external ear normal.     Left Ear: Tympanic membrane, ear canal and external ear normal.     Mouth/Throat:     Mouth: Mucous membranes are moist.     Pharynx: Oropharynx is clear.  Eyes:     Conjunctiva/sclera: Conjunctivae normal.  Cardiovascular:     Rate and Rhythm: Regular rhythm.     Heart sounds: Normal heart sounds.  Pulmonary:     Effort: Pulmonary effort is normal.     Breath sounds: Normal breath sounds.     Comments: Lungs clear to auscultation Musculoskeletal:     Cervical back: Neck supple.  Skin:    General: Skin is warm.  Neurological:     Mental Status: She is alert and oriented to person, place, and time.  Psychiatric:        Mood and Affect: Mood normal.        Behavior: Behavior normal.        Thought Content: Thought content normal.        Judgment: Judgment normal.    Diagnostics: FVC 3.37 L, FEV1 2.66 L.  Predicted FVC 3.97 L, predicted FEV1 3.24 L.  Spirometry indicates normal respiratory function.  Assessment and Plan: 1. Moderate persistent asthma, uncomplicated   2. Seasonal and perennial allergic rhinitis   3. Food  intolerance     No orders of the defined types were placed in this encounter.   Patient Instructions  1. Moderate persistent asthma, uncomplicated -Consider Tezspire.  We will wait to see if you are able to get assistance. - Daily controller medication(s): Breztri 2 puffs twice daily with spacer - Prior to physical activity: albuterol 2 puffs 10-15 minutes before physical activity. - Rescue medications: albuterol 4 puffs every 4-6 hours as needed - Asthma control goals:  * Full participation in all desired activities (may need albuterol before activity) *  Albuterol use two time or less a week on average (not counting use with activity) * Cough interfering with sleep two time or less a month * Oral steroids no more than once a year * No hospitalizations  2. Seasonal and perennial allergic rhinitis - with overlying pruritis (itchiness)(skin testing positive CZ:YSAYTKZ, ragweed, weeds, indoor molds, and dust mites). - Continue with: Zyrtec (cetirizine) 10 mg twice daily (alternate antihistamines every 1-2 months to improve their effectiveness)Recommend stopping Benadryl when your finances get better due to it causing you to be drowsy - Consider nasal saline rinses 1-2 times daily to remove allergens from the nasal cavities as well as help with mucous clearance (this is especially helpful to do before the nasal sprays are given) - Consider allergy shots as a means of long-term control. - Allergy shots "re-train" and "reset" the immune system to ignore environmental allergens and decrease the resulting immune response to those allergens (sneezing, itchy watery eyes, runny nose, nasal congestion, etc).    - Allergy shots improve symptoms in 75-85% of patients.  - We can discuss more at the next appointment if the medications are not working for you.  3. 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  IgE to milk negative  - You are ok to avoid,but should feel safe to introduce if  interested   Please let us know if this treatment plan is not working well for you. Schedule a follow up appointment in 2 months  Reducing Pollen Exposure  The American Academy of Allergy, Asthma and Immunology suggests the following steps to reduce your exposure to pollen during allergy seasons.    Do not hang sheets or clothing out to dry; pollen may collect on these items. Do not mow lawns or spend time around freshly cut grass; mowing stirs up pollen. Keep windows closed at night.  Keep car windows closed while driving. Minimize morning activities outdoors, a time when pollen counts are usually at their highest. Stay indoors as much as possible when pollen counts or humidity is high and on windy days when pollen tends to remain in the air longer. Use air conditioning when possible.  Many air conditioners have filters that trap the pollen spores. Use a HEPA room air filter to remove pollen form the indoor air you breathe.  Control of Mold Allergen   Mold and fungi can grow on a variety of surfaces provided certain temperature and moisture conditions exist.  Outdoor molds grow on plants, decaying vegetation and soil.  The major outdoor mold, Alternaria and Cladosporium, are found in very high numbers during hot and dry conditions.  Generally, a late Summer - Fall peak is seen for common outdoor fungal spores.  Rain will temporarily lower outdoor mold spore count, but counts rise rapidly when the rainy period ends.  The most important indoor molds are Aspergillus and Penicillium.  Dark, humid and poorly ventilated basements are ideal sites for mold growth.  The next most common sites of mold growth are the bathroom and the kitchen.   Indoor (Perennial) Mold Control   Positive indoor molds via skin testing: Aspergillus and Penicillium  Maintain humidity below 50%. Clean washable surfaces with 5% bleach solution. Remove sources e.g. contaminated carpets.    Control of Dust Mite  Allergen    Dust mites play a major role in allergic asthma and rhinitis.  They occur in environments with high humidity wherever human skin is found.  Dust mites absorb humidity from the atmosphere (ie, they  do not drink) and feed on organic matter (including shed human and animal skin).  Dust mites are a microscopic type of insect that you cannot see with the naked eye.  High levels of dust mites have been detected from mattresses, pillows, carpets, upholstered furniture, bed covers, clothes, soft toys and any woven material.  The principal allergen of the dust mite is found in its feces.  A gram of dust may contain 1,000 mites and 250,000 fecal particles.  Mite antigen is easily measured in the air during house cleaning activities.  Dust mites do not bite and do not cause harm to humans, other than by triggering allergies/asthma.    Ways to decrease your exposure to dust mites in your home:  Encase mattresses, box springs and pillows with a mite-impermeable barrier or cover   Wash sheets, blankets and drapes weekly in hot water (130 F) with detergent and dry them in a dryer on the hot setting.  Have the room cleaned frequently with a vacuum cleaner and a damp dust-mop.  For carpeting or rugs, vacuuming with a vacuum cleaner equipped with a high-efficiency particulate air (HEPA) filter.  The dust mite allergic individual should not be in a room which is being cleaned and should wait 1 hour after cleaning before going into the room. Do not sleep on upholstered furniture (eg, couches).   If possible removing carpeting, upholstered furniture and drapery from the home is ideal.  Horizontal blinds should be eliminated in the rooms where the person spends the most time (bedroom, study, television room).  Washable vinyl, roller-type shades are optimal. Remove all non-washable stuffed toys from the bedroom.  Wash stuffed toys weekly like sheets and blankets above.   Reduce indoor humidity to less than 50%.   Inexpensive humidity monitors can be purchased at most hardware stores.  Do not use a humidifier as can make the problem worse and are not recommended.        Return in about 2 months (around 01/16/2022), or if symptoms worsen or fail to improve.    Thank you for the opportunity to care for this patient.  Please do not hesitate to contact me with questions.  Nehemiah Settle, FNP [facility]

## 2021-11-27 ENCOUNTER — Telehealth: Payer: Self-pay | Admitting: *Deleted

## 2021-11-27 NOTE — Telephone Encounter (Signed)
L/m for patient to contact clinic to make appt to start Tezspire delivery 12/28

## 2021-12-09 ENCOUNTER — Ambulatory Visit (INDEPENDENT_AMBULATORY_CARE_PROVIDER_SITE_OTHER): Payer: Managed Care, Other (non HMO) | Admitting: Adult Health

## 2021-12-09 ENCOUNTER — Encounter: Payer: Self-pay | Admitting: Adult Health

## 2021-12-09 ENCOUNTER — Other Ambulatory Visit: Payer: Self-pay

## 2021-12-09 ENCOUNTER — Other Ambulatory Visit (HOSPITAL_COMMUNITY)
Admission: RE | Admit: 2021-12-09 | Discharge: 2021-12-09 | Disposition: A | Discharge status: Home / Self Care | Payer: Managed Care, Other (non HMO) | Source: Ambulatory Visit | Attending: Adult Health | Admitting: Adult Health

## 2021-12-09 VITALS — BP 125/87 | HR 95 | Ht 66.0 in | Wt 246.0 lb

## 2021-12-09 DIAGNOSIS — Z3041 Encounter for surveillance of contraceptive pills: Secondary | ICD-10-CM

## 2021-12-09 DIAGNOSIS — Z01419 Encounter for gynecological examination (general) (routine) without abnormal findings: Secondary | ICD-10-CM | POA: Diagnosis present

## 2021-12-09 NOTE — Progress Notes (Addendum)
Patient ID: Stacy Swanson, female   DOB: 25-Jan-1981, 41 y.o.   MRN: [id number] History of Present Illness: Disa is a 41 year old white female,single, G0P0, in for a well woman gyn exam and pap. Had recent break up. Has rainbow hair today. She works from home. PCP is Dr Margo Aye.   Current Medications, Allergies, Past Medical History, Past Surgical History, Family History and Social History were reviewed in [employer] record.     Review of Systems: Patient denies any headaches, hearing loss, fatigue, blurred vision, shortness of breath, chest pain, abdominal pain, problems with bowel movements, urination, or intercourse.(Not currently active). No joint pain or mood swings.     Physical Exam:BP 125/87 (BP Location: Right Arm, Patient Position: Sitting, Cuff Size: Large)    Pulse 95    Ht 5\' 6"  (1.676 m)    Wt 246 lb (111.6 kg)    LMP 11/03/2021    BMI 39.71 kg/m   General:  Well developed, well nourished, no acute distress Skin:  Warm and dry Neck:  Midline trachea, normal thyroid, good ROM, no lymphadenopathy Lungs; Clear to auscultation bilaterally Breast:  No dominant palpable mass, retraction, or nipple discharge Cardiovascular: Regular rate and rhythm Abdomen:  Soft, non tender, no hepatosplenomegaly Pelvic:  External genitalia is normal in appearance, no lesions.  The vagina is normal in appearance. Urethra has no lesions or masses. The cervix is nulliparous,pap with HR HPV genotyping performed.  Uterus is felt to be normal size, shape, and contour.  No adnexal masses or tenderness noted.Bladder is non tender, no masses felt. Rectal: Deferred Extremities/musculoskeletal:  No swelling or varicosities noted, no clubbing or cyanosis Psych:  No mood changes, alert and cooperative,seems happy AA is 2 Fall risk is low Depression screen [facility] 2/9 12/09/2021 09/20/2021  Decreased Interest 2 0  Down, Depressed, Hopeless 2 1  PHQ - 2 Score 4 1  Altered sleeping 2 3   Tired, decreased energy 2 3  Change in appetite 2 2  Feeling bad or failure about yourself  2 2  Trouble concentrating 2 2  Moving slowly or fidgety/restless 0 0  Suicidal thoughts 0 0  PHQ-9 Score 14 13   Is on Effexor GAD 7 : Generalized Anxiety Score 12/09/2021 09/20/2021  Nervous, Anxious, on Edge 2 1  Control/stop worrying 1 0  Worry too much - different things 2 1  Trouble relaxing 2 0  Restless 1 0  Easily annoyed or irritable 1 1  Afraid - awful might happen 0 0  Total GAD 7 Score 9 3       Upstream - 12/09/21 1146       Pregnancy Intention Screening   Does the patient want to become pregnant in the next year? No    Does the patient's partner want to become pregnant in the next year? No    Would the patient like to discuss contraceptive options today? No      Contraception Wrap Up   Current Method Oral Contraceptive    End Method Oral Contraceptive             Examination chaperoned by 02/06/22 LPN  Impression and Plan: 1. Encounter for gynecological examination with Papanicolaou smear of cervix Pap sent Physical in 1 year Pap in 3 years if normal Labs with PCP  2. Encounter for surveillance of contraceptive pills On [place], has refills

## 2021-12-10 LAB — CYTOLOGY - PAP
Adequacy: ABSENT
Comment: NEGATIVE
Diagnosis: NEGATIVE
High risk HPV: NEGATIVE

## 2021-12-27 ENCOUNTER — Other Ambulatory Visit: Payer: Self-pay

## 2021-12-27 ENCOUNTER — Ambulatory Visit (INDEPENDENT_AMBULATORY_CARE_PROVIDER_SITE_OTHER): Payer: Managed Care, Other (non HMO)

## 2021-12-27 DIAGNOSIS — J4540 Moderate persistent asthma, uncomplicated: Secondary | ICD-10-CM

## 2021-12-27 MED ORDER — TEZEPELUMAB-EKKO 210 MG/1.91ML ~~LOC~~ SOSY
210.0000 mg | PREFILLED_SYRINGE | SUBCUTANEOUS | Status: DC
  Administered 2021-12-27 – 2022-07-21 (×8): 210 mg via SUBCUTANEOUS

## 2022-01-21 NOTE — Patient Instructions (Addendum)
1. Moderate persistent asthma -Continue Tezspire per protocol  - Daily controller medication(s): Breztri 2 puffs twice daily with spacer. Make sure and take this medication every day - Prior to physical activity: albuterol 2 puffs 10-15 minutes before physical activity. - Rescue medications: albuterol 4 puffs every 4-6 hours as needed - Asthma control goals:  * Full participation in all desired activities (may need albuterol before activity) * Albuterol use two time or less a week on average (not counting use with activity) * Cough interfering with sleep two time or less a month * Oral steroids no more than once a year * No hospitalizations  2. Seasonal and perennial allergic rhinitis - with overlying pruritis (itchiness)(skin testing positive AC:ZYSAYTK, ragweed, weeds, indoor molds, and dust mites). - Continue with: Zyrtec (cetirizine) 10 mg once a day to twice a day as needed (alternate antihistamines every 1-2 months to improve their effectiveness) - Consider nasal saline rinses 1-2 times daily to remove allergens from the nasal cavities as well as help with mucous clearance (this is especially helpful to do before the nasal sprays are given) - Consider allergy shots as a means of long-term control. - Allergy shots "re-train" and "reset" the immune system to ignore environmental allergens and decrease the resulting immune response to those allergens (sneezing, itchy watery eyes, runny nose, nasal congestion, etc).    - Allergy shots improve symptoms in 75-85% of patients.  - We can discuss more at the next appointment if the medications are not working for you.  3. 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  IgE to milk negative  - You are ok to avoid,but should feel safe to introduce if interested   Please let us know if this treatment plan is not working well for you. Schedule a follow up appointment in 3-4 months or sooner if needed  Reducing Pollen  Exposure  The American Academy of Allergy, Asthma and Immunology suggests the following steps to reduce your exposure to pollen during allergy seasons.    Do not hang sheets or clothing out to dry; pollen may collect on these items. Do not mow lawns or spend time around freshly cut grass; mowing stirs up pollen. Keep windows closed at night.  Keep car windows closed while driving. Minimize morning activities outdoors, a time when pollen counts are usually at their highest. Stay indoors as much as possible when pollen counts or humidity is high and on windy days when pollen tends to remain in the air longer. Use air conditioning when possible.  Many air conditioners have filters that trap the pollen spores. Use a HEPA room air filter to remove pollen form the indoor air you breathe.  Control of Mold Allergen   Mold and fungi can grow on a variety of surfaces provided certain temperature and moisture conditions exist.  Outdoor molds grow on plants, decaying vegetation and soil.  The major outdoor mold, Alternaria and Cladosporium, are found in very high numbers during hot and dry conditions.  Generally, a late Summer - Fall peak is seen for common outdoor fungal spores.  Rain will temporarily lower outdoor mold spore count, but counts rise rapidly when the rainy period ends.  The most important indoor molds are Aspergillus and Penicillium.  Dark, humid and poorly ventilated basements are ideal sites for mold growth.  The next most common sites of mold growth are the bathroom and the kitchen.   Indoor (Perennial) Mold Control   Positive indoor molds via skin testing:  Aspergillus and Penicillium  Maintain humidity below 50%. Clean washable surfaces with 5% bleach solution. Remove sources e.g. contaminated carpets.    Control of Dust Mite Allergen    Dust mites play a major role in allergic asthma and rhinitis.  They occur in environments with high humidity wherever human skin is found.   Dust mites absorb humidity from the atmosphere (ie, they do not drink) and feed on organic matter (including shed human and animal skin).  Dust mites are a microscopic type of insect that you cannot see with the naked eye.  High levels of dust mites have been detected from mattresses, pillows, carpets, upholstered furniture, bed covers, clothes, soft toys and any woven material.  The principal allergen of the dust mite is found in its feces.  A gram of dust may contain 1,000 mites and 250,000 fecal particles.  Mite antigen is easily measured in the air during house cleaning activities.  Dust mites do not bite and do not cause harm to humans, other than by triggering allergies/asthma.    Ways to decrease your exposure to dust mites in your home:  Encase mattresses, box springs and pillows with a mite-impermeable barrier or cover   Wash sheets, blankets and drapes weekly in hot water (130 F) with detergent and dry them in a dryer on the hot setting.  Have the room cleaned frequently with a vacuum cleaner and a damp dust-mop.  For carpeting or rugs, vacuuming with a vacuum cleaner equipped with a high-efficiency particulate air (HEPA) filter.  The dust mite allergic individual should not be in a room which is being cleaned and should wait 1 hour after cleaning before going into the room. Do not sleep on upholstered furniture (eg, couches).   If possible removing carpeting, upholstered furniture and drapery from the home is ideal.  Horizontal blinds should be eliminated in the rooms where the person spends the most time (bedroom, study, television room).  Washable vinyl, roller-type shades are optimal. Remove all non-washable stuffed toys from the bedroom.  Wash stuffed toys weekly like sheets and blankets above.   Reduce indoor humidity to less than 50%.  Inexpensive humidity monitors can be purchased at most hardware stores.  Do not use a humidifier as can make the problem worse and are not  recommended.

## 2022-01-22 ENCOUNTER — Other Ambulatory Visit: Payer: Self-pay

## 2022-01-22 ENCOUNTER — Encounter: Payer: Self-pay | Admitting: Family

## 2022-01-22 ENCOUNTER — Ambulatory Visit: Payer: Managed Care, Other (non HMO)

## 2022-01-22 ENCOUNTER — Ambulatory Visit: Payer: Managed Care, Other (non HMO) | Admitting: Family

## 2022-01-22 VITALS — BP 126/78 | HR 84 | Resp 18

## 2022-01-22 DIAGNOSIS — J302 Other seasonal allergic rhinitis: Secondary | ICD-10-CM

## 2022-01-22 DIAGNOSIS — J4550 Severe persistent asthma, uncomplicated: Secondary | ICD-10-CM

## 2022-01-22 DIAGNOSIS — J3089 Other allergic rhinitis: Secondary | ICD-10-CM

## 2022-01-22 DIAGNOSIS — K9049 Malabsorption due to intolerance, not elsewhere classified: Secondary | ICD-10-CM | POA: Diagnosis not present

## 2022-01-22 DIAGNOSIS — J4540 Moderate persistent asthma, uncomplicated: Secondary | ICD-10-CM

## 2022-01-22 NOTE — Progress Notes (Signed)
[address] [place] [address] Dept: [phone]  FOLLOW UP NOTE  Patient ID: Stacy Swanson, female    DOB: 11-Nov-1981  Age: 41 y.o. MRN: [id number] Date of Office Visit: 01/22/2022  Assessment  Chief Complaint: Asthma  HPI Stacy Swanson is a 41 year old female who presents today for follow-up of moderate persistent asthma, seasonal and perennial allergic rhinitis, and food intolerance.  She was last seen on November 15, 2021 by myself.  Since her last office visit she denies any new diagnosis or surgeries.  Her friend is here with her today.  Moderate persistent asthma is reported as a lot better than what it was.  She continues to receive Tezspire injections per protocol, Breztri 2 puffs twice a day with spacer on most days, and albuterol as needed.  She reports off-and-on coughing if she forgets to take her allergy pills, rare wheezing, and shortness of breath only if walking up a hill, but mentions that it is nothing like it was previously.  She denies fever, chills, tightness in chest, and nocturnal awakenings.  Since her last office visit she has not required any systemic steroids or made any trips to the emergency room or urgent care due to breathing problems.  She reports very rare use of albuterol.  She denies any problems or reactions with her test by her injections.  Seasonal and perennial allergic rhinitis is reported as moderately controlled with Zyrtec 10 mg once a day on most days and saline rinse as needed.  She mentions that she uses her saline rinses sparingly because water gets stuck in her nose.  She reports nasal congestion at times and postnasal drip at times.  She denies rhinorrhea.  She has not had any sinus infections since we last saw her.  She does not feel like she needs a nasal spray for her nasal congestion at this time.  She mentions that she is not really avoiding milk as much right now, but mentions that she does not drink milk at  night.   Drug Allergies:  Allergies  Allergen Reactions   Codeine Other (See Comments)    Gave patient an ulcer.    Lactose Intolerance (Gi) Other (See Comments)    G.I. Upset.     Review of Systems: ROS Constitutional:  Negative for chills and fever.  HENT:         Reports nasal congestion at times and postnasal drip at times.  Denies rhinorrhea Eyes:        Reports occasional itchy eyes  Respiratory:  Positive for cough, shortness of breath and wheezing.  Reports off-and-on cough if she forgets her allergy Illes.  Reports rare wheezing and shortness of breath of only walking up a hill but reports that her shortness of breath is not as bad as what it was.  Denies tightness in chest and nocturnal awakenings due to breathing problem Cardiovascular:  Positive for palpitations. Negative for chest pain.      Reports palpitations only if she drinks too much coffee Gastrointestinal:        Reports occasional reflux symptoms if she eats something greasy Genitourinary:  Negative for frequency.  Skin:  Negative for itching and rash.  Neurological:  Positive for headaches.  Reports history of migraines and she is currently out of her preventative medication. Endo/Heme/Allergies:  Positive for environmental allergies  Physical Exam: BP 126/78    Pulse 84    Resp 18    SpO2 97%  Physical Exam HENT:     Head: Normocephalic and atraumatic.     Comments: Pharynx normal, eyes normal, ears normal, nose: Bilateral lower turbinates mildly edematous and slightly erythematous with no drainage noted    Right Ear: Tympanic membrane, ear canal and external ear normal.     Left Ear: Tympanic membrane, ear canal and external ear normal.     Mouth/Throat:     Mouth: Mucous membranes are moist.     Pharynx: Oropharynx is clear.  Eyes:     Conjunctiva/sclera: Conjunctivae normal.  Cardiovascular:     Rate and Rhythm: Regular rhythm.     Heart sounds: Normal heart sounds.  Pulmonary:     Effort:  Pulmonary effort is normal.     Breath sounds: Normal breath sounds.     Comments: Lungs clear to auscultation Musculoskeletal:     Cervical back: Neck supple.  Skin:    General: Skin is warm.  Neurological:     Mental Status: She is alert and oriented to person, place, and time.  Psychiatric:        Mood and Affect: Mood normal.        Behavior: Behavior normal.        Thought Content: Thought content normal.        Judgment: Judgment normal.  Diagnostics: FVC 2.84 L, FEV1 2.37 L (73%).  Predicted FVC 3.96 L, predicted FEV1 3.24 L.  Spirometry indicates possible mild restriction  Assessment and Plan: 1. Moderate persistent asthma, uncomplicated   2. Seasonal and perennial allergic rhinitis   3. Food intolerance     No orders of the defined types were placed in this encounter.   Patient Instructions  1. Moderate persistent asthma -Continue Tezspire per protocol  - Daily controller medication(s): Breztri 2 puffs twice daily with spacer. Make sure and take this medication every day - Prior to physical activity: albuterol 2 puffs 10-15 minutes before physical activity. - Rescue medications: albuterol 4 puffs every 4-6 hours as needed - Asthma control goals:  * Full participation in all desired activities (may need albuterol before activity) * Albuterol use two time or less a week on average (not counting use with activity) * Cough interfering with sleep two time or less a month * Oral steroids no more than once a year * No hospitalizations  2. Seasonal and perennial allergic rhinitis - with overlying pruritis (itchiness)(skin testing positive IW:PYKDXIP, ragweed, weeds, indoor molds, and dust mites). - Continue with: Zyrtec (cetirizine) 10 mg once a day to twice a day as needed (alternate antihistamines every 1-2 months to improve their effectiveness) - Consider nasal saline rinses 1-2 times daily to remove allergens from the nasal cavities as well as help with mucous clearance  (this is especially helpful to do before the nasal sprays are given) - Consider allergy shots as a means of long-term control. - Allergy shots "re-train" and "reset" the immune system to ignore environmental allergens and decrease the resulting immune response to those allergens (sneezing, itchy watery eyes, runny nose, nasal congestion, etc).    - Allergy shots improve symptoms in 75-85% of patients.  - We can discuss more at the next appointment if the medications are not working for you.  3. 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  IgE to milk negative  - You are ok to avoid,but should feel safe to introduce if interested   Please let us know if this treatment plan is not working well for you. Schedule  a follow up appointment in 3-4 months or sooner if needed  Reducing Pollen Exposure  The American Academy of Allergy, Asthma and Immunology suggests the following steps to reduce your exposure to pollen during allergy seasons.    Do not hang sheets or clothing out to dry; pollen may collect on these items. Do not mow lawns or spend time around freshly cut grass; mowing stirs up pollen. Keep windows closed at night.  Keep car windows closed while driving. Minimize morning activities outdoors, a time when pollen counts are usually at their highest. Stay indoors as much as possible when pollen counts or humidity is high and on windy days when pollen tends to remain in the air longer. Use air conditioning when possible.  Many air conditioners have filters that trap the pollen spores. Use a HEPA room air filter to remove pollen form the indoor air you breathe.  Control of Mold Allergen   Mold and fungi can grow on a variety of surfaces provided certain temperature and moisture conditions exist.  Outdoor molds grow on plants, decaying vegetation and soil.  The major outdoor mold, Alternaria and Cladosporium, are found in very high numbers during hot and dry conditions.   Generally, a late Summer - Fall peak is seen for common outdoor fungal spores.  Rain will temporarily lower outdoor mold spore count, but counts rise rapidly when the rainy period ends.  The most important indoor molds are Aspergillus and Penicillium.  Dark, humid and poorly ventilated basements are ideal sites for mold growth.  The next most common sites of mold growth are the bathroom and the kitchen.   Indoor (Perennial) Mold Control   Positive indoor molds via skin testing: Aspergillus and Penicillium  Maintain humidity below 50%. Clean washable surfaces with 5% bleach solution. Remove sources e.g. contaminated carpets.    Control of Dust Mite Allergen    Dust mites play a major role in allergic asthma and rhinitis.  They occur in environments with high humidity wherever human skin is found.  Dust mites absorb humidity from the atmosphere (ie, they do not drink) and feed on organic matter (including shed human and animal skin).  Dust mites are a microscopic type of insect that you cannot see with the naked eye.  High levels of dust mites have been detected from mattresses, pillows, carpets, upholstered furniture, bed covers, clothes, soft toys and any woven material.  The principal allergen of the dust mite is found in its feces.  A gram of dust may contain 1,000 mites and 250,000 fecal particles.  Mite antigen is easily measured in the air during house cleaning activities.  Dust mites do not bite and do not cause harm to humans, other than by triggering allergies/asthma.    Ways to decrease your exposure to dust mites in your home:  Encase mattresses, box springs and pillows with a mite-impermeable barrier or cover   Wash sheets, blankets and drapes weekly in hot water (130 F) with detergent and dry them in a dryer on the hot setting.  Have the room cleaned frequently with a vacuum cleaner and a damp dust-mop.  For carpeting or rugs, vacuuming with a vacuum cleaner equipped with a  high-efficiency particulate air (HEPA) filter.  The dust mite allergic individual should not be in a room which is being cleaned and should wait 1 hour after cleaning before going into the room. Do not sleep on upholstered furniture (eg, couches).   If possible removing carpeting, upholstered furniture and drapery  from the home is ideal.  Horizontal blinds should be eliminated in the rooms where the person spends the most time (bedroom, study, television room).  Washable vinyl, roller-type shades are optimal. Remove all non-washable stuffed toys from the bedroom.  Wash stuffed toys weekly like sheets and blankets above.   Reduce indoor humidity to less than 50%.  Inexpensive humidity monitors can be purchased at most hardware stores.  Do not use a humidifier as can make the problem worse and are not recommended.        Return in about 3 months (around 04/21/2022), or if symptoms worsen or fail to improve.    Thank you for the opportunity to care for this patient.  Please do not hesitate to contact me with questions.  Nehemiah Settle, FNP [facility]

## 2022-02-07 ENCOUNTER — Other Ambulatory Visit: Payer: Self-pay

## 2022-02-07 ENCOUNTER — Ambulatory Visit
Admission: RE | Admit: 2022-02-07 | Discharge: 2022-02-07 | Disposition: A | Discharge status: Home / Self Care | Payer: Managed Care, Other (non HMO) | Source: Ambulatory Visit | Attending: Family Medicine | Admitting: Family Medicine

## 2022-02-07 VITALS — BP 119/86 | HR 88 | Temp 98.5°F | Resp 18

## 2022-02-07 DIAGNOSIS — J3089 Other allergic rhinitis: Secondary | ICD-10-CM | POA: Diagnosis not present

## 2022-02-07 DIAGNOSIS — H6592 Unspecified nonsuppurative otitis media, left ear: Secondary | ICD-10-CM

## 2022-02-07 MED ORDER — DEXAMETHASONE SODIUM PHOSPHATE 10 MG/ML IJ SOLN
10.0000 mg | Freq: Once | INTRAMUSCULAR | Status: AC
  Administered 2022-02-07: 10 mg via INTRAMUSCULAR

## 2022-02-07 MED ORDER — AZITHROMYCIN 250 MG PO TABS: ORAL_TABLET | ORAL | 0 refills | Status: DC

## 2022-02-07 NOTE — ED Triage Notes (Signed)
Pt states that she has had pressure in her left ear and it wont pop starting about a week ago ? ?Pt states she does have sinus problems with excessive yellowish/ clear mucus for about 2 to 3 days ? ?Pt states that she has tried sinus meds ? ?Denies Fever ?

## 2022-02-07 NOTE — ED Provider Notes (Addendum)
[facility]    CSN: [id number] Arrival date & time: 02/07/22  1033      History   Chief Complaint Chief Complaint  Patient presents with   ear pressure    Sinus pressure and ear pressure    HPI Stacy Swanson is a 41 y.o. female.   Patient presenting today with 2 to 3 days of sinus drainage, nasal congestion, left ear pressure, popping, fullness, muffled hearing worsening over the last few days.  Denies fever, chills, chest pain, shortness of breath, abdominal pain, nausea, vomiting, diarrhea.  Trying sinus medication and allergy regimen with minimal relief.  No known sick contacts recently   Past Medical History:  Diagnosis Date   Asthma    Cluster headaches    Diverticulitis    Frequent UTI    Gallstones    Irritable bowel syndrome (IBS)    Mental disorder    depression   Migraine with aura, with intractable migraine, so stated, with status migrainosus    Ovarian cyst    Sinus drainage    Sinus headache    Wheezing without diagnosis of asthma    with exertion and high humidity    Patient Active Problem List   Diagnosis Date Noted   Encounter for surveillance of contraceptive pills 12/09/2021   Encounter for gynecological examination with Papanicolaou smear of cervix 12/09/2021   Elevated BP without diagnosis of hypertension 09/20/2021   Contraception management 09/20/2021   IBS (irritable bowel syndrome) 12/07/2015   Cholelithiasis 07/11/2015    Past Surgical History:  Procedure Laterality Date   CHOLECYSTECTOMY N/A 07/11/2015   Procedure: OPEN CHOLECYSTECTOMY;  Surgeon: Franky Macho, MD;  Location: [facility];  Service: General;  Laterality: N/A;   OOPHORECTOMY  1994   open with midline incision, 20+ cm cyst, multiple   TONSILLECTOMY     WISDOM TOOTH EXTRACTION  age 62    OB History     Gravida  0   Para  0   Term  0   Preterm  0   AB  0   Living  0      SAB  0   IAB  0   Ectopic  0   Multiple  0   Live Births  0             Home Medications    Prior to Admission medications   Medication Sig Start Date End Date Taking? Authorizing Provider  azithromycin (ZITHROMAX) 250 MG tablet Take first 2 tablets together, then 1 every day until finished. 02/07/22  Yes Particia Nearing, PA-C  acetaminophen (TYLENOL) 500 MG tablet Take 500-1,000 mg by mouth every 6 (six) hours as needed for moderate pain.    [provider]  albuterol (VENTOLIN HFA) 108 (90 Base) MCG/ACT inhaler Inhale 2 puffs into the lungs every 6 (six) hours as needed for wheezing or shortness of breath. 10/17/21   Alfonse Spruce, MD  Atogepant (QULIPTA PO) Take by mouth at bedtime.    [provider]  BREZTRI AEROSPHERE 160-9-4.8 MCG/ACT AERO Inhale 2 puffs into the lungs in the morning and at bedtime. 10/17/21   Alfonse Spruce, MD  cetirizine (ZYRTEC) 10 MG tablet Take 10 mg by mouth daily.    [provider]  desonide (DESOWEN) 0.05 % cream Apply topically. 07/09/21   [provider]  Drospirenone (SLYND) 4 MG TABS Take 1 tablet by mouth daily. 09/20/21   Arabella Merles, CNM  ibuprofen (ADVIL,MOTRIN) 200  MG tablet Take 400 mg by mouth every 6 (six) hours as needed for pain.    [provider]  metroNIDAZOLE (METROCREAM) 0.75 % cream Apply topically. 11/26/21   [provider]  naproxen sodium (ANAPROX) 220 MG tablet Take 220-440 mg by mouth daily as needed (cramping).    [provider]  ondansetron (ZOFRAN) 4 MG tablet TAKE 1 TABLET(4 MG) BY MOUTH EVERY 8 HOURS AS NEEDED FOR NAUSEA OR VOMITING 09/15/17   Tiffany Kocher, PA-C  polyethylene glycol [facility]) packet Take 17 g by mouth daily. Patient taking differently: Take 17 g by mouth as needed. 01/14/15   Janne Napoleon, NP  promethazine (PHENERGAN) 25 MG tablet Take 1 tablet (25 mg total) by mouth every 6 (six) hours as needed for nausea or vomiting. 08/21/21   Elson Areas, PA-C  pseudoephedrine-acetaminophen  (TYLENOL SINUS) 30-500 MG TABS Take 1 tablet by mouth every 4 (four) hours as needed (Headache).    [provider]  QULIPTA 60 MG TABS Take 1 tablet by mouth daily. 01/11/22   [provider]  terconazole (TERAZOL 7) 0.4 % vaginal cream Place 1 applicator vaginally at bedtime. Patient taking differently: Place 1 applicator vaginally as needed. 09/20/21   Arabella Merles, CNM  TEZSPIRE 210 MG/1. syringe Inject into the skin. 11/15/21   [provider]  venlafaxine XR (EFFEXOR-XR) 75 MG 24 hr capsule Take 75 mg by mouth daily. 02/12/20   [provider]    Family History Family History  Problem Relation Age of Onset   Suicidality Father    Gallbladder disease Mother    Asthma Brother    Depression Brother    Suicidality Brother    Thyroid disease Sister    Other Sister        had gallbladder removed; gastric bypass   Colon cancer Neg Hx     Social History Social History   Tobacco Use   Smoking status: Never    Passive exposure: Never   Smokeless tobacco: Never  Vaping Use   Vaping Use: Never used  Substance Use Topics   Alcohol use: Yes    Comment: occ   Drug use: No     Allergies   Codeine and Lactose intolerance (gi)   Review of Systems Review of Systems Per HPI  Physical Exam Triage Vital Signs ED Triage Vitals  Enc Vitals Group     BP 02/07/22 1117 119/86     Pulse Rate 02/07/22 1117 88     Resp 02/07/22 1117 18     Temp 02/07/22 1117 98.5 F (36.9 C)     Temp Source 02/07/22 1117 Oral     SpO2 02/07/22 1117 98 %     Weight --      Height --      Head Circumference --      Peak Flow --      Pain Score 02/07/22 1112 1     Pain Loc --      Pain Edu? --      Excl. in GC? --    No data found.  Updated Vital Signs BP 119/86 (BP Location: Right Arm)    Pulse 88    Temp 98.5 F (36.9 C) (Oral)    Resp 18    SpO2 98%   Visual Acuity Right Eye Distance:   Left Eye Distance:   Bilateral Distance:    Right Eye  Near:   Left Eye Near:    Bilateral  Near:     Physical Exam Vitals and nursing note reviewed.  Constitutional:      Appearance: Normal appearance.  HENT:     Head: Atraumatic.     Right Ear: Tympanic membrane and external ear normal.     Left Ear: External ear normal.     Ears:     Comments: Middle ear effusion, erythema and injection of TM    Nose: Rhinorrhea present.     Mouth/Throat:     Mouth: Mucous membranes are moist.     Pharynx: Posterior oropharyngeal erythema present.  Eyes:     Extraocular Movements: Extraocular movements intact.     Conjunctiva/sclera: Conjunctivae normal.  Cardiovascular:     Rate and Rhythm: Normal rate and regular rhythm.     Heart sounds: Normal heart sounds.  Pulmonary:     Effort: Pulmonary effort is normal.     Breath sounds: Normal breath sounds. No wheezing.  Musculoskeletal:        General: Normal range of motion.     Cervical back: Normal range of motion and neck supple.  Skin:    General: Skin is warm and dry.  Neurological:     Mental Status: She is alert and oriented to person, place, and time.     Motor: No weakness.     Gait: Gait normal.  Psychiatric:        Mood and Affect: Mood normal.        Thought Content: Thought content normal.    UC Treatments / Results  Labs (all labs ordered are listed, but only abnormal results are displayed) Labs Reviewed - No data to display  EKG   Radiology No results found.  Procedures Procedures (including critical care time)  Medications Ordered in UC Medications  dexamethasone (DECADRON) injection 10 mg (10 mg Intramuscular Given 02/07/22 1147)    Initial Impression / Assessment and Plan / UC Course  I have reviewed the triage vital signs and the nursing notes.  Pertinent labs & imaging results that were available during my care of the patient were reviewed by me and considered in my medical decision making (see chart for details).     Treat with IM Decadron, continued  allergy regimen, will also start azithromycin as the ear appears to be turning into an early infection.  Discussed supportive care and return precautions.  Final Clinical Impressions(s) / UC Diagnoses   Final diagnoses:  Left otitis media with effusion  Seasonal allergic rhinitis due to other allergic trigger   Discharge Instructions   None    ED Prescriptions     Medication Sig Dispense Auth. Provider   azithromycin (ZITHROMAX) 250 MG tablet Take first 2 tablets together, then 1 every day until finished. 6 tablet Particia NearingLane, Marabelle Cushman [place], [place]      PDMP not reviewed this encounter.   Particia NearingLane, Lamere Lightner [place], [place] 02/07/22 1545    Particia NearingLane, Chayne Baumgart [place], [place] 02/07/22 1545

## 2022-02-21 ENCOUNTER — Ambulatory Visit (INDEPENDENT_AMBULATORY_CARE_PROVIDER_SITE_OTHER): Payer: Managed Care, Other (non HMO)

## 2022-02-21 ENCOUNTER — Other Ambulatory Visit: Payer: Self-pay

## 2022-02-21 DIAGNOSIS — J4540 Moderate persistent asthma, uncomplicated: Secondary | ICD-10-CM

## 2022-03-08 ENCOUNTER — Emergency Department
Admission: EM | Admit: 2022-03-08 | Discharge: 2022-03-09 | Disposition: A | Discharge status: Home / Self Care | Payer: Managed Care, Other (non HMO) | Attending: Emergency Medicine | Admitting: Emergency Medicine

## 2022-03-08 DIAGNOSIS — R197 Diarrhea, unspecified: Secondary | ICD-10-CM | POA: Insufficient documentation

## 2022-03-08 DIAGNOSIS — R000 Tachycardia, unspecified: Secondary | ICD-10-CM | POA: Diagnosis not present

## 2022-03-08 DIAGNOSIS — R519 Headache, unspecified: Secondary | ICD-10-CM

## 2022-03-08 DIAGNOSIS — R110 Nausea: Secondary | ICD-10-CM | POA: Insufficient documentation

## 2022-03-08 DIAGNOSIS — J45909 Unspecified asthma, uncomplicated: Secondary | ICD-10-CM | POA: Insufficient documentation

## 2022-03-08 LAB — URINALYSIS, ROUTINE W REFLEX MICROSCOPIC
Bilirubin Urine: NEGATIVE
Glucose, UA: NEGATIVE mg/dL
Hgb urine dipstick: NEGATIVE
Ketones, ur: NEGATIVE mg/dL
Leukocytes,Ua: NEGATIVE
Nitrite: NEGATIVE
Protein, ur: 30 mg/dL — AB
Specific Gravity, Urine: 1.019 (ref 1.005–1.030)
pH: 5 (ref 5.0–8.0)

## 2022-03-08 LAB — COMPREHENSIVE METABOLIC PANEL
ALT: 33 U/L (ref 0–44)
AST: 29 U/L (ref 15–41)
Albumin: 3.8 g/dL (ref 3.5–5.0)
Alkaline Phosphatase: 71 U/L (ref 38–126)
Anion gap: 11 (ref 5–15)
BUN: 15 mg/dL (ref 6–20)
CO2: 22 mmol/L (ref 22–32)
Calcium: 8.6 mg/dL — ABNORMAL LOW (ref 8.9–10.3)
Chloride: 104 mmol/L (ref 98–111)
Creatinine, Ser: 0.82 mg/dL (ref 0.44–1.00)
GFR, Estimated: >60 mL/min (ref >60)
Glucose, Bld: 118 mg/dL — ABNORMAL HIGH (ref 70–99)
Potassium: 3.6 mmol/L (ref 3.5–5.1)
Sodium: 137 mmol/L (ref 135–145)
Total Bilirubin: 0.6 mg/dL (ref 0.3–1.2)
Total Protein: 8.4 g/dL — ABNORMAL HIGH (ref 6.5–8.1)

## 2022-03-08 LAB — CBC
HCT: 45.4 % (ref 36.0–46.0)
Hemoglobin: 14.7 g/dL (ref 12.0–15.0)
MCH: 28.8 pg (ref 26.0–34.0)
MCHC: 32.4 g/dL (ref 30.0–36.0)
MCV: 88.8 fL (ref 80.0–100.0)
Platelets: 400 10*3/uL (ref 150–400)
RBC: 5.11 MIL/uL (ref 3.87–5.11)
RDW: 13.1 % (ref 11.5–15.5)
WBC: 10.2 10*3/uL (ref 4.0–10.5)
nRBC: 0 % (ref 0.0–0.2)

## 2022-03-08 LAB — POC URINE PREG, ED: Preg Test, Ur: NEGATIVE

## 2022-03-08 LAB — LIPASE, BLOOD: Lipase: 44 U/L (ref 11–51)

## 2022-03-08 MED ORDER — SODIUM CHLORIDE 0.9 % IV BOLUS
1000.0000 mL | Freq: Once | INTRAVENOUS | Status: AC
Start: 2022-03-08 — End: 2022-03-08
  Administered 2022-03-08: 1000 mL via INTRAVENOUS

## 2022-03-08 MED ORDER — PROCHLORPERAZINE EDISYLATE 10 MG/2ML IJ SOLN
10.0000 mg | Freq: Once | INTRAMUSCULAR | Status: AC
  Administered 2022-03-08: 10 mg via INTRAVENOUS
  Filled 2022-03-08: qty 2

## 2022-03-08 MED ORDER — SODIUM CHLORIDE 0.9 % IV BOLUS
1000.0000 mL | Freq: Once | INTRAVENOUS | Status: AC
  Administered 2022-03-08: 1000 mL via INTRAVENOUS

## 2022-03-08 MED ORDER — PROCHLORPERAZINE MALEATE 10 MG PO TABS: 10.0000 mg | ORAL_TABLET | Freq: Three times a day (TID) | ORAL | 0 refills | Status: AC | PRN

## 2022-03-08 NOTE — ED Provider Notes (Signed)
? ?[facility] ?Provider Note ? ? ? Event Date/Time  ? First MD Initiated Contact with Patient 03/08/22 2037   ?  (approximate) ? ? ?History  ? ?Migraine ? ? ?HPI ? ?Stacy Swanson is a 41 y.o. female  who, per outpatient clinic noted dated 02/21/22 has history of asthma, who presents to the emergency department today because of concern for migraine headache as well as nausea.  The patient states that her headache started this morning.  It has been constant and severe throughout the day.  Patient has had accompanying nausea.  She also states she has had some diarrhea.  She has tried drinking fluids without being able to keep any down.  The patient denies any fevers. ? ?  ? ? ?Physical Exam  ? ?Triage Vital Signs: ?ED Triage Vitals [03/08/22 2016]  ?Enc Vitals Group  ?   BP 112/90  ?   Pulse Rate (!) 151  ?   Resp 20  ?   Temp 98.4 ?F (36.9 ?C)  ?   Temp Source Oral  ?   SpO2 96 %  ? ?Most recent vital signs: ?Vitals:  ? 03/08/22 2016  ?BP: 112/90  ?Pulse: (!) 151  ?Resp: 20  ?Temp: 98.4 ?F (36.9 ?C)  ?SpO2: 96%  ? ? ?General: Awake, uncomfortable appearing ?CV:  Good peripheral perfusion. Tachycardia ?Resp:  Normal effort. Clear to auscultation ?Abd:  No distention. Non tender. ? ? ?ED Results / Procedures / Treatments  ? ?Labs ?(all labs ordered are listed, but only abnormal results are displayed) ?Labs Reviewed  ?COMPREHENSIVE METABOLIC PANEL - Abnormal; Notable for the following components:  ?    Result Value  ? Glucose, Bld 118 (*)   ? Calcium 8.6 (*)   ? Total Protein 8.4 (*)   ? All other components within normal limits  ?URINALYSIS, ROUTINE W REFLEX MICROSCOPIC - Abnormal; Notable for the following components:  ? Color, Urine YELLOW (*)   ? APPearance CLOUDY (*)   ? Protein, ur 30 (*)   ? Bacteria, UA MANY (*)   ? All other components within normal limits  ?LIPASE, BLOOD  ?CBC  ?POC URINE PREG, ED  ? ? ? ?EKG ? ?IPhineas Semen, attending physician, personally viewed and  interpreted this EKG ? ?EKG Time: 2019 ?Rate: 140 ?Rhythm: sinus tachycardia ?Axis: normal ?Intervals: qtc 415 ?QRS: narrow ?ST changes: no st elevation ?Impression: abnormal ekg ? ?RADIOLOGY ?None ? ? ?PROCEDURES: ? ?Critical Care performed: No ? ?Procedures ? ? ?MEDICATIONS ORDERED IN ED: ?Medications - No data to display ? ? ?IMPRESSION / MDM / ASSESSMENT AND PLAN / ED COURSE  ?I reviewed the triage vital signs and the nursing notes. ?             ?               ? ?Differential diagnosis includes, but is not limited to, migraines, dehydration. ? ?Patient presents to the emergency department today because of concerns for headaches.  Patient also complained of some nausea and diarrhea.  Initial vital signs were notable for significant tachycardia.  It does appear sinus on the EKG.  Blood work was obtained which did not show any concerning electrolyte abnormality.  No leukocytosis or anemia.  Patient did feel better after IV Compazine.  After 1 L bolus her heart rate had improved.  She continued to still be slightly tachycardic.  I do think this could be related to dehydration secondary to  inability to tolerate p.o. today.  Will give a second liter.  In the event the patient continues to feel better and her heart rate improves I do think she would be suitable for discharge. ? ? ?FINAL CLINICAL IMPRESSION(S) / ED DIAGNOSES  ? ?Final diagnoses:  ?Bad headache  ?Sinus tachycardia  ? ? ? ?Note:  This document was prepared using Dragon voice recognition software and may include unintentional dictation errors. ? ?  ?Phineas Semen, MD ?03/08/22 2322 ? ?

## 2022-03-08 NOTE — Discharge Instructions (Signed)
Please seek medical attention for any high fevers, chest pain, shortness of breath, change in behavior, persistent vomiting, bloody stool or any other new or concerning symptoms.  

## 2022-03-08 NOTE — ED Notes (Signed)
Pt placed on cardiac monitoring.  

## 2022-03-11 ENCOUNTER — Ambulatory Visit
Admission: EM | Admit: 2022-03-11 | Discharge: 2022-03-11 | Disposition: A | Discharge status: Home / Self Care | Payer: Managed Care, Other (non HMO) | Attending: Urgent Care | Admitting: Urgent Care

## 2022-03-11 ENCOUNTER — Encounter: Payer: Self-pay | Admitting: Emergency Medicine

## 2022-03-11 DIAGNOSIS — A084 Viral intestinal infection, unspecified: Secondary | ICD-10-CM | POA: Diagnosis not present

## 2022-03-11 DIAGNOSIS — R197 Diarrhea, unspecified: Secondary | ICD-10-CM | POA: Diagnosis present

## 2022-03-11 DIAGNOSIS — R112 Nausea with vomiting, unspecified: Secondary | ICD-10-CM | POA: Diagnosis present

## 2022-03-11 DIAGNOSIS — R300 Dysuria: Secondary | ICD-10-CM

## 2022-03-11 DIAGNOSIS — R350 Frequency of micturition: Secondary | ICD-10-CM | POA: Diagnosis not present

## 2022-03-11 LAB — POCT URINALYSIS DIP (MANUAL ENTRY)
Glucose, UA: NEGATIVE mg/dL
Leukocytes, UA: NEGATIVE
Nitrite, UA: NEGATIVE
Protein Ur, POC: 30 mg/dL — AB
Spec Grav, UA: >=1.03 — AB (ref 1.010–1.025)
Urobilinogen, UA: 0.2 E.U./dL
pH, UA: 6 (ref 5.0–8.0)

## 2022-03-11 MED ORDER — LOPERAMIDE HCL 2 MG PO CAPS: 2.0000 mg | ORAL_CAPSULE | Freq: Two times a day (BID) | ORAL | 0 refills | Status: DC | PRN

## 2022-03-11 MED ORDER — ONDANSETRON HCL 4 MG/2ML IJ SOLN
4.0000 mg | Freq: Once | INTRAMUSCULAR | Status: AC
  Administered 2022-03-11: 4 mg via INTRAMUSCULAR

## 2022-03-11 MED ORDER — ONDANSETRON 8 MG PO TBDP: 8.0000 mg | ORAL_TABLET | Freq: Three times a day (TID) | ORAL | 0 refills | Status: DC | PRN

## 2022-03-11 NOTE — Discharge Instructions (Signed)

## 2022-03-11 NOTE — ED Provider Notes (Signed)
?[facility] ? ? ?MRN: [id number] DOB: 1981/07/26 ? ?Subjective:  ? ?Stacy Swanson is a 40 y.o. female presenting for 3 day history of dysuria, urinary frequency, diarrhea, vomiting. Was seen in the emergency room for this. Results are as below. She is not taking any medications for this because of concern that she would vomit the medicine. She is holding fluids better now. NO longer has headaches. Has a history of frequent UTIs, history of oophorectomy. She is on contraception. No recent antibiotic use, travel outside the country. Diet routine is the same. Has a history of diverticulitis but was not diagnosed through a CT scan.  ? ? ?Current Facility-Administered Medications:  ?  tezepelumab-ekko (TEZSPIRE) 210 MG/1. syringe 210 mg, 210 mg, Subcutaneous, Q28 days, Alfonse Spruce, MD, 210 mg at 02/21/22 1102 ? ?Current Outpatient Medications:  ?  acetaminophen (TYLENOL) 500 MG tablet, Take 500-1,000 mg by mouth every 6 (six) hours as needed for moderate pain., Disp: , Rfl:  ?  albuterol (VENTOLIN HFA) 108 (90 Base) MCG/ACT inhaler, Inhale 2 puffs into the lungs every 6 (six) hours as needed for wheezing or shortness of breath., Disp: 18 g, Rfl: 1 ?  Atogepant (QULIPTA PO), Take by mouth at bedtime., Disp: , Rfl:  ?  azithromycin (ZITHROMAX) 250 MG tablet, Take first 2 tablets together, then 1 every day until finished., Disp: 6 tablet, Rfl: 0 ?  BREZTRI AEROSPHERE 160-9-4.8 MCG/ACT AERO, Inhale 2 puffs into the lungs in the morning and at bedtime., Disp: 10.7 g, Rfl: 5 ?  cetirizine (ZYRTEC) 10 MG tablet, Take 10 mg by mouth daily., Disp: , Rfl:  ?  desonide (DESOWEN) 0.05 % cream, Apply topically., Disp: , Rfl:  ?  Drospirenone (SLYND) 4 MG TABS, Take 1 tablet by mouth daily., Disp: 28 tablet, Rfl: 12 ?  ibuprofen (ADVIL,MOTRIN) 200 MG tablet, Take 400 mg by mouth every 6 (six) hours as needed for pain., Disp: , Rfl:  ?  metroNIDAZOLE (METROCREAM) 0.75 % cream, Apply topically.,  Disp: , Rfl:  ?  naproxen sodium (ANAPROX) 220 MG tablet, Take 220-440 mg by mouth daily as needed (cramping)., Disp: , Rfl:  ?  ondansetron (ZOFRAN) 4 MG tablet, TAKE 1 TABLET(4 MG) BY MOUTH EVERY 8 HOURS AS NEEDED FOR NAUSEA OR VOMITING, Disp: 30 tablet, Rfl: 0 ?  polyethylene glycol (MIRALAX) packet, Take 17 g by mouth daily. (Patient taking differently: Take 17 g by mouth as needed.), Disp: 14 each, Rfl: 0 ?  prochlorperazine (COMPAZINE) 10 MG tablet, Take 1 tablet (10 mg total) by mouth every 8 (eight) hours as needed (headache)., Disp: 20 tablet, Rfl: 0 ?  promethazine (PHENERGAN) 25 MG tablet, Take 1 tablet (25 mg total) by mouth every 6 (six) hours as needed for nausea or vomiting., Disp: 20 tablet, Rfl: 0 ?  pseudoephedrine-acetaminophen (TYLENOL SINUS) 30-500 MG TABS, Take 1 tablet by mouth every 4 (four) hours as needed (Headache)., Disp: , Rfl:  ?  QULIPTA 60 MG TABS, Take 1 tablet by mouth daily., Disp: , Rfl:  ?  terconazole (TERAZOL 7) 0.4 % vaginal cream, Place 1 applicator vaginally at bedtime. (Patient taking differently: Place 1 applicator vaginally as needed.), Disp: 45 g, Rfl: 3 ?  TEZSPIRE 210 MG/1. syringe, Inject into the skin., Disp: , Rfl:  ?  venlafaxine XR (EFFEXOR-XR) 75 MG 24 hr capsule, Take 75 mg by mouth daily., Disp: , Rfl:   ? ?Allergies  ?Allergen Reactions  ? Codeine Other (See Comments)  ?  Gave patient an ulcer.   ? Lactose Intolerance (Gi) Other (See Comments)  ?  G.I. Upset.   ? ? ?Past Medical History:  ?Diagnosis Date  ? Asthma   ? Cluster headaches   ? Diverticulitis   ? Frequent UTI   ? Gallstones   ? Irritable bowel syndrome (IBS)   ? Mental disorder   ? depression  ? Migraine with aura, with intractable migraine, so stated, with status migrainosus   ? Ovarian cyst   ? Sinus drainage   ? Sinus headache   ? Wheezing without diagnosis of asthma   ? with exertion and high humidity  ?  ? ?Past Surgical History:  ?Procedure Laterality Date  ? CHOLECYSTECTOMY N/A 07/11/2015   ? Procedure: OPEN CHOLECYSTECTOMY;  Surgeon: Franky Macho, MD;  Location: [facility];  Service: General;  Laterality: N/A;  ? OOPHORECTOMY  1994  ? open with midline incision, 20+ cm cyst, multiple  ? TONSILLECTOMY    ? WISDOM TOOTH EXTRACTION  age 45  ? ? ?Family History  ?Problem Relation Age of Onset  ? Suicidality Father   ? Gallbladder disease Mother   ? Asthma Brother   ? Depression Brother   ? Suicidality Brother   ? Thyroid disease Sister   ? Other Sister   ?     had gallbladder removed; gastric bypass  ? Colon cancer Neg Hx   ? ? ?Social History  ? ?Tobacco Use  ? Smoking status: Never  ?  Passive exposure: Never  ? Smokeless tobacco: Never  ?Vaping Use  ? Vaping Use: Never used  ?Substance Use Topics  ? Alcohol use: Yes  ?  Comment: occ  ? Drug use: No  ? ? ?ROS ? ? ?Objective:  ? ?Vitals: ?BP (!) 114/94 (BP Location: Right Arm)   Pulse (!) 115   Temp 99 ?F (37.2 ?C) (Oral)   Resp 18   SpO2 98%  ? ?Physical Exam ?Constitutional:   ?   General: She is not in acute distress. ?   Appearance: Normal appearance. She is well-developed. She is not ill-appearing, toxic-appearing or diaphoretic.  ?HENT:  ?   Head: Normocephalic and atraumatic.  ?   Right Ear: External ear normal.  ?   Left Ear: External ear normal.  ?   Nose: Nose normal.  ?   Mouth/Throat:  ?   Mouth: Mucous membranes are moist.  ?   Pharynx: No oropharyngeal exudate or posterior oropharyngeal erythema.  ?Eyes:  ?   General: No scleral icterus.    ?   Right eye: No discharge.     ?   Left eye: No discharge.  ?   Extraocular Movements: Extraocular movements intact.  ?   Conjunctiva/sclera: Conjunctivae normal.  ?Cardiovascular:  ?   Rate and Rhythm: Normal rate.  ?   Heart sounds: No murmur heard. ?  No friction rub. No gallop.  ?Pulmonary:  ?   Effort: Pulmonary effort is normal. No respiratory distress.  ?   Breath sounds: No stridor. No wheezing, rhonchi or rales.  ?Chest:  ?   Chest wall: No tenderness.  ?Abdominal:  ?   General: Bowel sounds  are normal. There is no distension.  ?   Palpations: Abdomen is soft. There is no mass.  ?   Tenderness: There is abdominal tenderness in the right lower quadrant, periumbilical area and suprapubic area. There is no right CVA tenderness, left CVA tenderness, guarding or rebound.  ?Skin: ?  General: Skin is warm and dry.  ?Neurological:  ?   General: No focal deficit present.  ?   Mental Status: She is alert and oriented to person, place, and time.  ?Psychiatric:     ?   Mood and Affect: Mood normal.     ?   Behavior: Behavior normal.     ?   Thought Content: Thought content normal.     ?   Judgment: Judgment normal.  ? ? ?Results for orders placed or performed during the hospital encounter of 03/11/22 (from the past 24 hour(s))  ?POCT urinalysis dipstick     Status: Abnormal  ? Collection Time: 03/11/22  7:43 PM  ?Result Value Ref Range  ? Color, UA yellow yellow  ? Clarity, UA clear clear  ? Glucose, UA negative negative mg/dL  ? Bilirubin, UA small (A) negative  ? Ketones, POC UA trace (5) (A) negative mg/dL  ? Spec Grav, UA >=1.030 (A) 1.010 - 1.025  ? Blood, UA small (A) negative  ? pH, UA 6.0 5.0 - 8.0  ? Protein Ur, POC =30 (A) negative mg/dL  ? Urobilinogen, UA 0.2 0.2 or 1.0 E.U./dL  ? Nitrite, UA Negative Negative  ? Leukocytes, UA Negative Negative  ? ? ?Recent Results (from the past 2160 hour(s))  ?Lipase, blood     Status: None  ? Collection Time: 03/08/22  8:25 PM  ?Result Value Ref Range  ? Lipase 44 11 - 51 U/L  ?  Comment: Performed at [facility], [address], [place], [place] [address]  ?Comprehensive metabolic panel     Status: Abnormal  ? Collection Time: 03/08/22  8:25 PM  ?Result Value Ref Range  ? Sodium 137 135 - 145 mmol/L  ? Potassium 3.6 3.5 - 5.1 mmol/L  ? Chloride 104 98 - 111 mmol/L  ? CO2 22 22 - 32 mmol/L  ? Glucose, Bld 118 (H) 70 - 99 mg/dL  ?  Comment: Glucose reference range applies only to samples taken after fasting for at least 8 hours.  ? BUN 15 6 - 20 mg/dL  ?  Creatinine, Ser 0.82 0.44 - 1.00 mg/dL  ? Calcium 8.6 (L) 8.9 - 10.3 mg/dL  ? Total Protein 8.4 (H) 6.5 - 8.1 g/dL  ? Albumin 3.8 3.5 - 5.0 g/dL  ? AST 29 15 - 41 U/L  ? ALT 33 0 - 44 U/L  ? Alkaline Phosph

## 2022-03-11 NOTE — ED Triage Notes (Signed)
Headache since Saturday with vomiting and diarrhea.  Was seen in ED on Saturday night for vomiting.  States symptoms continue.  States headache is gone now.  ? ?Burning on urination and lower back pain since yesterday. ?

## 2022-03-14 LAB — URINE CULTURE: Culture: NO GROWTH

## 2022-03-21 ENCOUNTER — Ambulatory Visit (INDEPENDENT_AMBULATORY_CARE_PROVIDER_SITE_OTHER): Payer: Managed Care, Other (non HMO)

## 2022-03-21 DIAGNOSIS — J4550 Severe persistent asthma, uncomplicated: Secondary | ICD-10-CM

## 2022-03-21 DIAGNOSIS — J4540 Moderate persistent asthma, uncomplicated: Secondary | ICD-10-CM

## 2022-04-21 ENCOUNTER — Ambulatory Visit: Payer: Managed Care, Other (non HMO)

## 2022-04-22 NOTE — Patient Instructions (Incomplete)
1. Moderate persistent asthma -Continue Tezspire per protocol  - Daily controller medication(s): Breztri 2 puffs twice daily with spacer. Make sure and take this medication every day - Prior to physical activity: albuterol 2 puffs 10-15 minutes before physical activity. - Rescue medications: albuterol 4 puffs every 4-6 hours as needed - Asthma control goals:  * Full participation in all desired activities (may need albuterol before activity) * Albuterol use two time or less a week on average (not counting use with activity) * Cough interfering with sleep two time or less a month * Oral steroids no more than once a year * No hospitalizations  2. Seasonal and perennial allergic rhinitis - with overlying pruritis (itchiness)(skin testing positive [id number], ragweed, weeds, indoor molds, and dust mites). - Continue with: Zyrtec (cetirizine) 10 mg once a day to twice a day as needed (alternate antihistamines every 1-2 months to improve their effectiveness) - Consider nasal saline rinses 1-2 times daily to remove allergens from the nasal cavities as well as help with mucous clearance (this is especially helpful to do before the nasal sprays are given) - Consider allergy shots as a means of long-term control. - Allergy shots "re-train" and "reset" the immune system to ignore environmental allergens and decrease the resulting immune response to those allergens (sneezing, itchy watery eyes, runny nose, nasal congestion, etc).    - Allergy shots improve symptoms in 75-85% of patients.  - We can discuss more at the next appointment if the medications are not working for you.  3. 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  IgE to milk negative  - You are ok to avoid,but should feel safe to introduce if interested   Please let us know if this treatment plan is not working well for you. Schedule a follow up appointment in  months or sooner if needed  Reducing Pollen Exposure  The  American Academy of Allergy, Asthma and Immunology suggests the following steps to reduce your exposure to pollen during allergy seasons.    Do not hang sheets or clothing out to dry; pollen may collect on these items. Do not mow lawns or spend time around freshly cut grass; mowing stirs up pollen. Keep windows closed at night.  Keep car windows closed while driving. Minimize morning activities outdoors, a time when pollen counts are usually at their highest. Stay indoors as much as possible when pollen counts or humidity is high and on windy days when pollen tends to remain in the air longer. Use air conditioning when possible.  Many air conditioners have filters that trap the pollen spores. Use a HEPA room air filter to remove pollen form the indoor air you breathe.  Control of Mold Allergen   Mold and fungi can grow on a variety of surfaces provided certain temperature and moisture conditions exist.  Outdoor molds grow on plants, decaying vegetation and soil.  The major outdoor mold, Alternaria and Cladosporium, are found in very high numbers during hot and dry conditions.  Generally, a late Summer - Fall peak is seen for common outdoor fungal spores.  Rain will temporarily lower outdoor mold spore count, but counts rise rapidly when the rainy period ends.  The most important indoor molds are Aspergillus and Penicillium.  Dark, humid and poorly ventilated basements are ideal sites for mold growth.  The next most common sites of mold growth are the bathroom and the kitchen.   Indoor (Perennial) Mold Control   Positive indoor molds via skin testing:  Aspergillus and Penicillium  Maintain humidity below 50%. Clean washable surfaces with 5% bleach solution. Remove sources e.g. contaminated carpets.    Control of Dust Mite Allergen    Dust mites play a major role in allergic asthma and rhinitis.  They occur in environments with high humidity wherever human skin is found.  Dust mites absorb  humidity from the atmosphere (ie, they do not drink) and feed on organic matter (including shed human and animal skin).  Dust mites are a microscopic type of insect that you cannot see with the naked eye.  High levels of dust mites have been detected from mattresses, pillows, carpets, upholstered furniture, bed covers, clothes, soft toys and any woven material.  The principal allergen of the dust mite is found in its feces.  A gram of dust may contain 1,000 mites and 250,000 fecal particles.  Mite antigen is easily measured in the air during house cleaning activities.  Dust mites do not bite and do not cause harm to humans, other than by triggering allergies/asthma.    Ways to decrease your exposure to dust mites in your home:  Encase mattresses, box springs and pillows with a mite-impermeable barrier or cover   Wash sheets, blankets and drapes weekly in hot water (130 F) with detergent and dry them in a dryer on the hot setting.  Have the room cleaned frequently with a vacuum cleaner and a damp dust-mop.  For carpeting or rugs, vacuuming with a vacuum cleaner equipped with a high-efficiency particulate air (HEPA) filter.  The dust mite allergic individual should not be in a room which is being cleaned and should wait 1 hour after cleaning before going into the room. Do not sleep on upholstered furniture (eg, couches).   If possible removing carpeting, upholstered furniture and drapery from the home is ideal.  Horizontal blinds should be eliminated in the rooms where the person spends the most time (bedroom, study, television room).  Washable vinyl, roller-type shades are optimal. Remove all non-washable stuffed toys from the bedroom.  Wash stuffed toys weekly like sheets and blankets above.   Reduce indoor humidity to less than 50%.  Inexpensive humidity monitors can be purchased at most hardware stores.  Do not use a humidifier as can make the problem worse and are not recommended.

## 2022-04-23 ENCOUNTER — Ambulatory Visit: Payer: Managed Care, Other (non HMO)

## 2022-04-23 ENCOUNTER — Encounter: Payer: Self-pay | Admitting: Family

## 2022-04-23 ENCOUNTER — Ambulatory Visit: Payer: Managed Care, Other (non HMO) | Admitting: Family

## 2022-04-23 VITALS — BP 110/76 | HR 56 | Wt 249.0 lb

## 2022-04-23 DIAGNOSIS — J3089 Other allergic rhinitis: Secondary | ICD-10-CM | POA: Diagnosis not present

## 2022-04-23 DIAGNOSIS — J302 Other seasonal allergic rhinitis: Secondary | ICD-10-CM | POA: Diagnosis not present

## 2022-04-23 DIAGNOSIS — J4550 Severe persistent asthma, uncomplicated: Secondary | ICD-10-CM | POA: Diagnosis not present

## 2022-04-23 DIAGNOSIS — K9049 Malabsorption due to intolerance, not elsewhere classified: Secondary | ICD-10-CM

## 2022-04-23 NOTE — Progress Notes (Signed)
[address] [place] [id number] Dept: [phone]  FOLLOW UP NOTE  Patient ID: Stacy Swanson, female    DOB: 11-28-1981  Age: 41 y.o. MRN: [id number] Date of Office Visit: 04/23/2022  Assessment  Chief Complaint: Allergies (Increase in itching, allergy medication doesn't help. Also has a stuffy head.) and Asthma (Has occasional coughs here and there. )  HPI Stacy Swanson is a 41 year old female who presents today for follow-up on moderate persistent asthma, seasonal and perennial allergic rhinitis, and food intolerance.  She was last seen on January 22, 2022 by myself.  She denies any new diagnosis or surgery since her last office visit.  Moderate persistent asthma is reported as moderately controlled with Breztri 2 puffs once a day with spacer, Tezspire injections every 4 weeks, and albuterol as needed.  After her last Tezspire injection her arm hurt for 24 hours.  She forgets to use her nighttime dose of Breztri and she also forgot to use her Breztri this morning.  She reports a little bit of cough today.  She also has shortness of breath if walking a lot.  She feels tightness in her chest but thinks that this is due to postnasal drip.  She denies wheezing and nocturnal awakenings due to breathing problems.  Since her last office visit she has not required any systemic steroids or made any trips to the emergency room or urgent care due to breathing problems.  She uses her albuterol inhaler approximately 2-3 times a week.  She mentions that she tries to avoid using her albuterol inhaler but will use it when she has crackling in her chest after cleaning.  Seasonal and perennial allergic rhinitis is reported as not well controlled with Zyrtec 2 tablets in the morning and saline spray as needed.  She currently does not use any nasal sprays, but if she were to use a nasal spray it would need to be in a mist form.  She also does not use saline rinses unless she has a  sinus infection because otherwise she feels like saline gets stuck in her sinuses.  She reports nasal and head congestion and postnasal drip especially in the morning.  She denies rhinorrhea.  She has not had any sinus infections since we last saw her.  She has tried taking loratadine from month and this did not help with her congestion.  She continues to at times be itchy and denies any rashes or hives.  She then says maybe after a couple days of being itchy she may see a hive in her hairline.  When she is itchy she will use Benadryl as needed.  Her itching does not occur all the time.  She reports that she has "kind of added back milk into her diet".  She is able to eat cheese without any problems, but she is also "on a not interested in milk kick."   Drug Allergies:  Allergies  Allergen Reactions   Codeine Other (See Comments)    Gave patient an ulcer.    Lactose Intolerance (Gi) Other (See Comments)    G.I. Upset.     Review of Systems: Review of Systems  Constitutional:  Negative for chills and fever.  HENT:         Reports nasal congestion, congestion in her head, and postnasal drip.  Denies rhinorrhea  Eyes:        Denies itchy watery eyes  Respiratory:  Positive for cough and shortness of breath. Negative for  wheezing.        Reports a little bit of cough today, but she forgot to take her Breztri this morning.  Also reports shortness of breath if working out a lot.  Denies wheezing and nocturnal awakenings due to breathing problems.  She mentions that she does have tightness that she feels like is due to postnasal drip.  Cardiovascular:  Positive for palpitations. Negative for chest pain.       Reports palpitations only if she "overdoses on caffeine"  Gastrointestinal:        Reports reflux symptoms if she eats after 6 PM.  If she does eat after 6 PM she will take a Tums.  Skin:  Positive for itching.       Reports itching at times where she does not see a rash, but then she  mentions she may see a hive in the hairline.  Neurological:  Positive for headaches.       Reports history of migraines  Endo/Heme/Allergies:  Positive for environmental allergies.    Physical Exam: BP 110/76   Pulse (!) 56   Wt 249 lb (112.9 kg)   SpO2 99%   BMI 40.19 kg/m    Physical Exam Constitutional:      Appearance: Normal appearance.  HENT:     Head: Normocephalic and atraumatic.     Comments: Pharynx normal, eyes normal, ears normal, nose: Bilateral lower turbinates moderately edematous and slightly erythematous with no drainage noted    Right Ear: Tympanic membrane, ear canal and external ear normal.     Left Ear: Tympanic membrane, ear canal and external ear normal.     Mouth/Throat:     Mouth: Mucous membranes are moist.     Pharynx: Oropharynx is clear.  Eyes:     Conjunctiva/sclera: Conjunctivae normal.  Cardiovascular:     Rate and Rhythm: Regular rhythm. Bradycardia present.     Heart sounds: Normal heart sounds.  Pulmonary:     Effort: Pulmonary effort is normal.     Breath sounds: Normal breath sounds.     Comments: Lungs clear to auscultation Musculoskeletal:     Cervical back: Neck supple.  Skin:    General: Skin is warm.     Comments: No rash or urticarial lesions noted  Neurological:     Mental Status: She is alert and oriented to person, place, and time.  Psychiatric:        Mood and Affect: Mood normal.        Behavior: Behavior normal.        Thought Content: Thought content normal.        Judgment: Judgment normal.    Diagnostics: FVC 3.12 L (79%), FEV1 2.55 L (79%).  Predicted FVC 3.96 L, predicted FEV1 3.23 L.  Spirometry indicates possible mild restriction.  Assessment and Plan: 1. Severe persistent asthma without complication   2. Seasonal and perennial allergic rhinitis   3. Food intolerance     No orders of the defined types were placed in this encounter.   Patient Instructions  1. Moderate persistent asthma -Continue  Tezspire per protocol  - Daily controller medication(s):  Increase Breztri 2 puffs twice daily with spacer. Set a reminder on your phone or set your Breztri inhaler next to your tooth brush as a reminder to use Breztri before you brush your teeth in the morning and at night - Prior to physical activity: albuterol 2 puffs 10-15 minutes before physical activity. - Rescue medications: albuterol 4 puffs every  4-6 hours as needed - Asthma control goals:  * Full participation in all desired activities (may need albuterol before activity) * Albuterol use two time or less a week on average (not counting use with activity) * Cough interfering with sleep two time or less a month * Oral steroids no more than once a year * No hospitalizations  2. Seasonal and perennial allergic rhinitis - with overlying pruritis (itchiness)(skin testing positive NW:GNFAOZH, ragweed, weeds, indoor molds, and dust mites). -Start over the counter Flonase Sensimist 1-2 sprays in each nostril once a day as needed for stuffy nose. In the right nostril, point the applicator out toward the right ear. In the left nostril, point the applicator out toward the left ear - Continue with: Zyrtec (cetirizine) 10 mg once a day to twice a day as needed (alternate antihistamines every 1-2 months to improve their effectiveness) - Consider nasal saline rinses 1-2 times daily to remove allergens from the nasal cavities as well as help with mucous clearance (this is especially helpful to do before the nasal sprays are given) - Consider allergy shots as a means of long-term control. - Allergy shots "re-train" and "reset" the immune system to ignore environmental allergens and decrease the resulting immune response to those allergens (sneezing, itchy watery eyes, runny nose, nasal congestion, etc).    - Allergy shots improve symptoms in 75-85% of patients.  - We can discuss more at the next appointment if the medications are not working for you.  3.  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  IgE to milk negative  - You are ok to avoid,but should feel safe to introduce if interested   Please let us know if this treatment plan is not working well for you. Schedule a follow up appointment in 4-6 months or sooner if needed  Reducing Pollen Exposure  The American Academy of Allergy, Asthma and Immunology suggests the following steps to reduce your exposure to pollen during allergy seasons.    Do not hang sheets or clothing out to dry; pollen may collect on these items. Do not mow lawns or spend time around freshly cut grass; mowing stirs up pollen. Keep windows closed at night.  Keep car windows closed while driving. Minimize morning activities outdoors, a time when pollen counts are usually at their highest. Stay indoors as much as possible when pollen counts or humidity is high and on windy days when pollen tends to remain in the air longer. Use air conditioning when possible.  Many air conditioners have filters that trap the pollen spores. Use a HEPA room air filter to remove pollen form the indoor air you breathe.  Control of Mold Allergen   Mold and fungi can grow on a variety of surfaces provided certain temperature and moisture conditions exist.  Outdoor molds grow on plants, decaying vegetation and soil.  The major outdoor mold, Alternaria and Cladosporium, are found in very high numbers during hot and dry conditions.  Generally, a late Summer - Fall peak is seen for common outdoor fungal spores.  Rain will temporarily lower outdoor mold spore count, but counts rise rapidly when the rainy period ends.  The most important indoor molds are Aspergillus and Penicillium.  Dark, humid and poorly ventilated basements are ideal sites for mold growth.  The next most common sites of mold growth are the bathroom and the kitchen.   Indoor (Perennial) Mold Control   Positive indoor molds via skin testing: Aspergillus and  Penicillium  Maintain humidity below 50%. Clean washable surfaces with 5% bleach solution. Remove sources e.g. contaminated carpets.    Control of Dust Mite Allergen    Dust mites play a major role in allergic asthma and rhinitis.  They occur in environments with high humidity wherever human skin is found.  Dust mites absorb humidity from the atmosphere (ie, they do not drink) and feed on organic matter (including shed human and animal skin).  Dust mites are a microscopic type of insect that you cannot see with the naked eye.  High levels of dust mites have been detected from mattresses, pillows, carpets, upholstered furniture, bed covers, clothes, soft toys and any woven material.  The principal allergen of the dust mite is found in its feces.  A gram of dust may contain 1,000 mites and 250,000 fecal particles.  Mite antigen is easily measured in the air during house cleaning activities.  Dust mites do not bite and do not cause harm to humans, other than by triggering allergies/asthma.    Ways to decrease your exposure to dust mites in your home:  Encase mattresses, box springs and pillows with a mite-impermeable barrier or cover   Wash sheets, blankets and drapes weekly in hot water (130 F) with detergent and dry them in a dryer on the hot setting.  Have the room cleaned frequently with a vacuum cleaner and a damp dust-mop.  For carpeting or rugs, vacuuming with a vacuum cleaner equipped with a high-efficiency particulate air (HEPA) filter.  The dust mite allergic individual should not be in a room which is being cleaned and should wait 1 hour after cleaning before going into the room. Do not sleep on upholstered furniture (eg, couches).   If possible removing carpeting, upholstered furniture and drapery from the home is ideal.  Horizontal blinds should be eliminated in the rooms where the person spends the most time (bedroom, study, television room).  Washable vinyl, roller-type shades are  optimal. Remove all non-washable stuffed toys from the bedroom.  Wash stuffed toys weekly like sheets and blankets above.   Reduce indoor humidity to less than 50%.  Inexpensive humidity monitors can be purchased at most hardware stores.  Do not use a humidifier as can make the problem worse and are not recommended.        Return in about 6 months (around 10/24/2022), or if symptoms worsen or fail to improve.    Thank you for the opportunity to care for this patient.  Please do not hesitate to contact me with questions.  Nehemiah Settle, FNP [facility]

## 2022-05-05 ENCOUNTER — Telehealth: Payer: Self-pay | Admitting: *Deleted

## 2022-05-05 NOTE — Telephone Encounter (Signed)
Patient Ins has denied her Tezspire due to her spiro readings not improved enough for reapproval. Per Ins she has to have had 12% and FEV1 from start of therapy or positive exercise or bronchial challenge testing. What we could do is bring her in and do another spiro hopefully with better results in order to get her reapproved.    Diagnostic studies:     Please advise

## 2022-05-05 NOTE — Telephone Encounter (Signed)
Yes, that sounds like a good idea. Please update her on your findings.

## 2022-05-07 NOTE — Telephone Encounter (Signed)
I called patient and advised that next injection will need to do spiro with best effort to get patient reapproved for Tezspire and made note on her inj appt to do same

## 2022-05-07 NOTE — Telephone Encounter (Signed)
Thank you :)

## 2022-05-21 ENCOUNTER — Ambulatory Visit: Payer: Managed Care, Other (non HMO)

## 2022-05-21 ENCOUNTER — Encounter: Payer: Self-pay | Admitting: Allergy & Immunology

## 2022-05-23 ENCOUNTER — Ambulatory Visit (INDEPENDENT_AMBULATORY_CARE_PROVIDER_SITE_OTHER): Payer: Managed Care, Other (non HMO)

## 2022-05-23 DIAGNOSIS — J4550 Severe persistent asthma, uncomplicated: Secondary | ICD-10-CM | POA: Diagnosis not present

## 2022-06-06 ENCOUNTER — Ambulatory Visit: Payer: Managed Care, Other (non HMO)

## 2022-06-20 ENCOUNTER — Ambulatory Visit: Payer: Managed Care, Other (non HMO)

## 2022-06-20 DIAGNOSIS — J4550 Severe persistent asthma, uncomplicated: Secondary | ICD-10-CM | POA: Diagnosis not present

## 2022-07-02 ENCOUNTER — Telehealth: Payer: Self-pay | Admitting: *Deleted

## 2022-07-02 MED ORDER — TEZSPIRE 210 MG/1.91ML ~~LOC~~ SOAJ: 210.0000 mg | SUBCUTANEOUS | 11 refills | Status: DC

## 2022-07-02 NOTE — Telephone Encounter (Signed)
Advised patient of approval for Tezspire pen which her Ins had previously denied. Will send Rx for the pen and she will bring same into clinic for admin instrux

## 2022-07-03 NOTE — Telephone Encounter (Signed)
Called patient and advised new approval for the Tezspire pen and new Rx to [facility]

## 2022-07-21 ENCOUNTER — Ambulatory Visit: Payer: Managed Care, Other (non HMO)

## 2022-07-21 DIAGNOSIS — J4550 Severe persistent asthma, uncomplicated: Secondary | ICD-10-CM

## 2022-07-21 NOTE — Progress Notes (Signed)
Immunotherapy   Patient Details  Name: Stacy Swanson MRN: [id number] Date of Birth: 10/28/1981  07/21/2022  Stacy Swanson started injections for  home administration for Tezspire.  Following schedule: Every twenty eight days.  Frequency:Every twenty eight days.  Epi-Pen:Not needed Consent signed previously and patient instructions given on how to self administer at home. Patient waited in the lobby for thirty minutes without an issue.   Ralene Muskrat 07/21/2022, 10:21 AM

## 2022-09-08 ENCOUNTER — Telehealth: Payer: Self-pay | Admitting: *Deleted

## 2022-09-08 NOTE — Telephone Encounter (Signed)
L/m for patient in response to her message regarding Ins change. She need to get me info when she gets it for prior approval and effective date of same

## 2022-09-22 ENCOUNTER — Encounter: Payer: Self-pay | Admitting: Internal Medicine

## 2022-09-22 ENCOUNTER — Ambulatory Visit: Payer: 59 | Admitting: Internal Medicine

## 2022-09-22 VITALS — BP 106/70 | Temp 97.3°F | Resp 18 | Ht 66.0 in | Wt 250.0 lb

## 2022-09-22 DIAGNOSIS — L501 Idiopathic urticaria: Secondary | ICD-10-CM

## 2022-09-22 DIAGNOSIS — J3089 Other allergic rhinitis: Secondary | ICD-10-CM

## 2022-09-22 DIAGNOSIS — L299 Pruritus, unspecified: Secondary | ICD-10-CM | POA: Diagnosis not present

## 2022-09-22 DIAGNOSIS — J4550 Severe persistent asthma, uncomplicated: Secondary | ICD-10-CM

## 2022-09-22 DIAGNOSIS — J302 Other seasonal allergic rhinitis: Secondary | ICD-10-CM

## 2022-09-22 MED ORDER — AZELASTINE HCL 0.1 % NA SOLN: 1.0000 | Freq: Two times a day (BID) | NASAL | 5 refills | Status: DC

## 2022-09-22 MED ORDER — BREZTRI AEROSPHERE 160-9-4.8 MCG/ACT IN AERO: 2.0000 | INHALATION_SPRAY | Freq: Two times a day (BID) | RESPIRATORY_TRACT | 5 refills | Status: DC

## 2022-09-22 MED ORDER — FLONASE SENSIMIST 27.5 MCG/SPRAY NA SUSP: 1.0000 | Freq: Every day | NASAL | 5 refills | Status: DC

## 2022-09-22 MED ORDER — ALBUTEROL SULFATE HFA 108 (90 BASE) MCG/ACT IN AERS: 2.0000 | INHALATION_SPRAY | Freq: Four times a day (QID) | RESPIRATORY_TRACT | 1 refills | Status: DC | PRN

## 2022-09-22 MED ORDER — ALBUTEROL SULFATE HFA 108 (90 BASE) MCG/ACT IN AERS
2.0000 | INHALATION_SPRAY | Freq: Four times a day (QID) | RESPIRATORY_TRACT | 1 refills | Status: DC | PRN
Start: 2022-09-22 — End: 2022-09-22

## 2022-09-22 MED ORDER — FEXOFENADINE HCL 180 MG PO TABS: 180.0000 mg | ORAL_TABLET | Freq: Two times a day (BID) | ORAL | 5 refills | Status: DC

## 2022-09-22 NOTE — Patient Instructions (Addendum)
Return in about 2 months (around 11/22/2022).     Itching: - Use Cerave cream to moisturize twice daily. - Start Allegra 180mg  twice daily, instead of benadryl.  Can increase to 360mg  twice daily.  Rhinitis: - Use nasal saline rinses before nose sprays such as with Neilmed Sinus Rinse.  Use distilled water.   - Use Flonase Sensimist 1-2 sprays each nostril daily. Aim upward and outward. - Use Azelastine 1-2 sprays each nostril twice daily. Aim upward and outward.   Asthma: - Maintenance inhaler: Breztri 2 puffs twice daily - Rescue inhaler: Albuterol 2 puffs via spacer or 1 vial via nebulizer every 4-6 hours as needed for respiratory symptoms of cough, shortness of breath, or wheezing Asthma control goals:  Full participation in all desired activities (may need albuterol before activity) Albuterol use two times or less a week on average (not counting use with activity) Cough interfering with sleep two times or less a month Oral steroids no more than once a year No hospitalizations

## 2022-09-22 NOTE — Progress Notes (Signed)
FOLLOW UP Date of Service/Encounter:  09/22/22   Subjective:  Stacy Swanson (DOB: 1981-01-16) is a 41 y.o. female [facility] for follow up/acute visit.  History obtained from: chart review and patient.  Itching: Reports onset of itching about 2 weeks ago.  She has seen her PCP for similar thing in the past.  Initially thought it was something in the bedroom that she was allergic to but now she has noticed it is happening all over the house.  She has tried cleaning without much improvement.  She wonders if she is allergic to dust. Initially she said there was no rashes with the itching but then reported some bumps and may be hives.  She did not have any pictures of it with her.  She has tried over-the-counter cortisone cream without relief.  She takes Benadryl multiple times throughout the day; it does help but then the itching comes back.  She has also been using a homemade oregano mix on her skin which does calm it down.  She also reports having a lot of stress. No new products/foods.   Asthma Reports he is doing okay since last visit.  She was on Tezspire but had to be put on hold due to switching insurance and has noticed some increased symptoms without it.  Usually has coughing and shortness of breath with cleaning and sometimes requires albuterol for this.  Using albuterol about 1-3 times a week.  She is on Breztri, prescribed 2 puffs BID but usually forgets to take the evening dose. No nighttime sxs, ER visits or oral prednisone since last visit    Allergies Reports some trouble with congestion and runny nose, no ocular symptoms. She is using Benadryl as needed as discussed above for the itching.  She is not using any nose sprays due to cost.   Past Medical History: Past Medical History:  Diagnosis Date   Asthma    Cluster headaches    Diverticulitis    Frequent UTI    Gallstones    Irritable bowel syndrome (IBS)     Mental disorder    depression   Migraine with aura, with intractable migraine, so stated, with status migrainosus    Ovarian cyst    Sinus drainage    Sinus headache    Wheezing without diagnosis of asthma    with exertion and high humidity    Objective:  BP 106/70 (BP Location: Left Arm, Patient Position: Sitting, Cuff Size: Large)   Temp (!) 97.3 F (36.3 C) (Temporal)   Resp 18   Ht 5\' 6"  (1.676 m)   Wt 250 lb (113.4 kg)   BMI 40.35 kg/m  Body mass index is 40.35 kg/m. Physical Exam: GEN: alert, well developed HEENT: clear conjunctiva, TM grey and translucent, nose with moderate inferior turbinate hypertrophy, pink nasal mucosa, clear rhinorrhea, + cobblestoning HEART: regular rate and rhythm, no murmur LUNGS: clear to auscultation bilaterally, no coughing, unlabored respiration SKIN: few areas of excoriations, no obvious hives  Data Reviewed:  SPT 10/2021: + grasses, ragweed, weeds, indoor molds, and dust mites  Spirometry:  Tracings reviewed. Her effort: Good reproducible efforts. FVC: 3.32L FEV1: 2.59 L, 80% predicted FEV1/FVC ratio: 78% Interpretation: Spirometry consistent with normal pattern.  Please see scanned spirometry results for details.    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  This might not be an environmental trigger but related to stress. - Use  Cerave cream to moisturize twice daily. - Start Allegra 180mg  twice daily, instead of benadryl.  Can increase to 360mg  twice daily.  Allergic Rhinitis Allergic Conjunctivitis - Use nasal saline rinses before nose sprays such as with Neilmed Sinus Rinse.  Use distilled water.   - Use Flonase Sensimist 1-2 sprays each nostril daily. Aim upward and outward. - Use Azelastine 1-2 sprays each nostril twice daily. Aim upward and outward.   Severe persistent asthma, uncontrolled - Maintenance inhaler: Breztri 2 puffs twice daily.  Remember to take this  twice daily not just once a day.  Restart Tezpsire once insurance is sorted.  She is already in touch with [facility] - Rescue inhaler: Albuterol 2 puffs via spacer or 1 vial via nebulizer every 4-6 hours as needed for respiratory symptoms of cough, shortness of breath, or wheezing Asthma control goals:  Full participation in all desired activities (may need albuterol before activity) Albuterol use two times or less a week on average (not counting use with activity) Cough interfering with sleep two times or less a month Oral steroids no more than once a year No hospitalizations     Return in about 2 months (around 11/22/2022). Harlon Flor, MD  [facility]

## 2022-09-24 ENCOUNTER — Ambulatory Visit: Payer: Managed Care, Other (non HMO) | Admitting: Family

## 2022-10-01 NOTE — Telephone Encounter (Signed)
L/m for patient Ins denied her Tezspire and I am appealling same. If she needs to come in for sample she can reachout to me to arrange

## 2022-10-20 ENCOUNTER — Other Ambulatory Visit: Payer: Self-pay

## 2022-10-20 MED ORDER — TERCONAZOLE 0.4 % VA CREA: 1.0000 | TOPICAL_CREAM | Freq: Every day | VAGINAL | 3 refills | Status: AC

## 2022-12-08 ENCOUNTER — Ambulatory Visit: Payer: 59 | Admitting: Internal Medicine

## 2022-12-16 NOTE — Patient Instructions (Addendum)
Asthma Continue Breztri 2 puffs twice a day with a spacer to prevent cough or wheeze Continue albuterol 2 puffs once every 4 hours as needed for cough or wheeze You may use albuterol 2 puffs 5 to 15 minutes before activity to decrease cough or wheeze Continue Tezspire injections once every 28 days. Make an appointment to get your injection.   Allergic rhinitis Continue allergen avoidance measures directed toward grass pollen, weed pollen, ragweed pollen, mold, and dust mite as listed below Continue Allegra 180 mg once a day as needed for runny nose or itch Continue Flonase Sensamist once a day as needed for stuffy nose Continue azelastine 1 to 2 sprays in each nostril twice a day as needed for a runny nose Consider saline nasal rinses as needed for nasal symptoms. Use this before any medicated nasal sprays for best result Consider allergen immunotherapy if your symptoms are not well-controlled with the treatment plan as listed above  Chronic urticaria Continue Allegra 180 mg twice a day as needed for hives and itching.  You may take Allegra 180 mg 4 times a day if needed for breakthrough symptoms Continue a twice a day moisturizing routine  Reflux Continue dietary and lifestyle modifications as listed below Begin famotidine 20 mg once a day if needed for reflux control  Call the clinic if this treatment plan is not working well for you.  Follow up in 3 months or sooner if needed.  Reducing Pollen Exposure The American Academy of Allergy, Asthma and Immunology suggests the following steps to reduce your exposure to pollen during allergy seasons. Do not hang sheets or clothing out to dry; pollen may collect on these items. Do not mow lawns or spend time around freshly cut grass; mowing stirs up pollen. Keep windows closed at night.  Keep car windows closed while driving. Minimize morning activities outdoors, a time when pollen counts are usually at their highest. Stay indoors as much as  possible when pollen counts or humidity is high and on windy days when pollen tends to remain in the air longer. Use air conditioning when possible.  Many air conditioners have filters that trap the pollen spores. Use a HEPA room air filter to remove pollen form the indoor air you breathe.  Control of Mold Allergen Mold and fungi can grow on a variety of surfaces provided certain temperature and moisture conditions exist.  Outdoor molds grow on plants, decaying vegetation and soil.  The major outdoor mold, Alternaria and Cladosporium, are found in very high numbers during hot and dry conditions.  Generally, a late Summer - Fall peak is seen for common outdoor fungal spores.  Rain will temporarily lower outdoor mold spore count, but counts rise rapidly when the rainy period ends.  The most important indoor molds are Aspergillus and Penicillium.  Dark, humid and poorly ventilated basements are ideal sites for mold growth.  The next most common sites of mold growth are the bathroom and the kitchen.  Outdoor [employer] Use air conditioning and keep windows closed Avoid exposure to decaying vegetation. Avoid leaf raking. Avoid grain handling. Consider wearing a face mask if working in moldy areas.  Indoor Mold Control Maintain humidity below 50%. Clean washable surfaces with 5% bleach solution. Remove sources e.g. Contaminated carpets.   Control of Dust Mite Allergen Dust mites play a major role in allergic asthma and rhinitis. They occur in environments with high humidity wherever human skin is found. Dust mites absorb humidity from the atmosphere (ie, they do  not drink) and feed on organic matter (including shed human and animal skin). Dust mites are a microscopic type of insect that you cannot see with the naked eye. High levels of dust mites have been detected from mattresses, pillows, carpets, upholstered furniture, bed covers, clothes, soft toys and any woven material. The principal allergen  of the dust mite is found in its feces. A gram of dust may contain 1,000 mites and 250,000 fecal particles. Mite antigen is easily measured in the air during house cleaning activities. Dust mites do not bite and do not cause harm to humans, other than by triggering allergies/asthma.  Ways to decrease your exposure to dust mites in your home:  1. Encase mattresses, box springs and pillows with a mite-impermeable barrier or cover  2. Wash sheets, blankets and drapes weekly in hot water (130 F) with detergent and dry them in a dryer on the hot setting.  3. Have the room cleaned frequently with a vacuum cleaner and a damp dust-mop. For carpeting or rugs, vacuuming with a vacuum cleaner equipped with a high-efficiency particulate air (HEPA) filter. The dust mite allergic individual should not be in a room which is being cleaned and should wait 1 hour after cleaning before going into the room.  4. Do not sleep on upholstered furniture (eg, couches).  5. If possible removing carpeting, upholstered furniture and drapery from the home is ideal. Horizontal blinds should be eliminated in the rooms where the person spends the most time (bedroom, study, television room). Washable vinyl, roller-type shades are optimal.  6. Remove all non-washable stuffed toys from the bedroom. Wash stuffed toys weekly like sheets and blankets above.  7. Reduce indoor humidity to less than 50%. Inexpensive humidity monitors can be purchased at most hardware stores. Do not use a humidifier as can make the problem worse and are not recommended.

## 2022-12-16 NOTE — Progress Notes (Signed)
Stacy Swanson, [address]  [place] [id number] Dept: [phone]  FOLLOW UP NOTE  Patient ID: Stacy Swanson, female    DOB: Feb 19, 1981  Age: 42 y.o. MRN: [id number] Date of Office Visit: 12/17/2022 asthma asthma  Assessment  Chief Complaint: Follow-up (Few bouts of Asthma here and there-has been using her albuterol a little more /Most of her itching has stopped )  HPI Stacy Swanson is a 42 year old female who presents to the clinic for follow-up visit.  She was last seen in this clinic on 09/22/2022 by Dr. Posey Pronto for evaluation of asthma, allergic rhinitis, and possible urticaria.    At today's visit, she reports her asthma has been moderately well-controlled with symptoms including shortness of breath with activity, occasional wheezing with activity and laughing, and cough producing mucus in the morning only.  She continues Breztri 2 puffs twice a day with a spacer and has used albuterol 2-3 times a week with moderate relief of symptoms.  She is not currently using a spacer with her Breztri. Due to change in her insurance, she reports that she was not able to afford tezspire injections and had her last injection in October.  Since that time, she has been approved and will restart tezspire injections.  She reports that she has an injection in the [place] office and needs to make an appointment to receive that injection.    Allergic rhinitis is reported as moderately well-controlled with occasional clear rhinorrhea, nasal congestion occurring at night, occasional sneezing, and copious postnasal drainage mainly occurring at nighttime or anytime when lying down.  She is not currently using Flonase, azelastine, or nasal saline rinses. Her last environmental allergy testing was on 10/18/2021 and was positive to grass pollen, weed pollen, ragweed pollen, mold, and dust mite.  She had negative selected food testing on 10/18/2021.   Urticaria is reported as moderately well-controlled  with no hive breakouts and itch is reported as moderately well-controlled with fewer episodes of itching.  She continues Allegra 180 mg twice a day and uses CeraVe as a moisturizer.    Reflux is reported as moderately well-controlled with heartburn as the main symptom after eating late at night or eating foods that aggravate her reflux.  She continues Tums as needed with relief of symptoms.  Her current medications are listed in the chart.    Drug Allergies:  Allergies  Allergen Reactions   Codeine Other (See Comments)    Gave patient an ulcer.    Lactose Intolerance (Gi) Other (See Comments)    G.I. Upset.     Physical Exam: Pulse 99   Temp 98.7 F (37.1 C)   Resp 16   SpO2 98%    Physical Exam Vitals reviewed.  Constitutional:      Appearance: Normal appearance.  HENT:     Head: Normocephalic and atraumatic.     Right Ear: Tympanic membrane normal.     Left Ear: Tympanic membrane normal.     Nose:     Comments: Bilateral nares slightly erythematous with clear nasal drainage noted.  Pharynx normal.  Ears normal.  Eyes normal.    Mouth/Throat:     Pharynx: Oropharynx is clear.  Eyes:     Conjunctiva/sclera: Conjunctivae normal.  Cardiovascular:     Rate and Rhythm: Normal rate and regular rhythm.     Heart sounds: Normal heart sounds. No murmur heard. Pulmonary:     Effort: Pulmonary effort is normal.     Breath sounds: Normal breath sounds.  Comments: Lungs clear to auscultation Musculoskeletal:        General: Normal range of motion.     Cervical back: Normal range of motion and neck supple.  Skin:    General: Skin is warm and dry.  Neurological:     Mental Status: She is alert and oriented to person, place, and time.  Psychiatric:        Mood and Affect: Mood normal.        Behavior: Behavior normal.        Thought Content: Thought content normal.        Judgment: Judgment normal.     Diagnostics: FVC 3.22, FEV1 2.22. Predicted FVC 3.94., predicted  FEV1 3.22. Spirometry indicates obstruction. Post bronchodilator FVC 3.06 postbronchodilator FEV1 2.38.  Postbronchodilator with 7% improvement in FEV1.  Assessment and Plan: 1. Severe persistent asthma without complication   2. Seasonal and perennial allergic rhinitis   3. Idiopathic urticaria   4. Pruritus     Meds ordered this encounter  Medications   albuterol (VENTOLIN HFA) 108 (90 Base) MCG/ACT inhaler    Sig: Inhale 2 puffs into the lungs every 6 (six) hours as needed for wheezing or shortness of breath.    Dispense:  18 g    Refill:  1    Patient Instructions  Asthma Continue Breztri 2 puffs twice a day to prevent cough or wheeze Continue albuterol 2 puffs once every 4 hours as needed for cough or wheeze You may use albuterol 2 puffs 5 to 15 minutes before activity to decrease cough or wheeze Continue tezspire injections once every 28 days. Make an appointment to get your injection.   Allergic rhinitis Continue allergen avoidance measures directed toward grass pollen, weed pollen, ragweed pollen, mold, and dust mite as listed below Continue Allegra 180 mg once a day as needed for runny nose or itch Continue Flonase Sensamist once a day as needed for stuffy nose Continue azelastine 1 to 2 sprays in each nostril twice a day as needed for a runny nose Consider saline nasal rinses as needed for nasal symptoms. Use this before any medicated nasal sprays for best result Consider allergen immunotherapy if your symptoms are not well-controlled with the treatment plan as listed above  Chronic urticaria Continue Allegra 180 mg twice a day as needed for hives and itching.  You may take Allegra 180 mg 4 times a day if needed for breakthrough symptoms Continue a twice a day moisturizing routine  Reflux Continue dietary and lifestyle modifications as listed below Begin famotidine 20 mg once a day if needed for reflux control  Call the clinic if this treatment plan is not working  well for you.  Follow up in 3 months or sooner if needed.   Return in about 3 months (around 03/18/2023), or if symptoms worsen or fail to improve.    Thank you for the opportunity to care for this patient.  Please do not hesitate to contact me with questions.  Gareth Morgan, FNP [facility]

## 2022-12-17 ENCOUNTER — Telehealth: Payer: Self-pay | Admitting: Family Medicine

## 2022-12-17 ENCOUNTER — Encounter: Payer: Self-pay | Admitting: Family Medicine

## 2022-12-17 ENCOUNTER — Ambulatory Visit (INDEPENDENT_AMBULATORY_CARE_PROVIDER_SITE_OTHER): Payer: 59 | Admitting: Family Medicine

## 2022-12-17 VITALS — HR 99 | Temp 98.7°F | Resp 16

## 2022-12-17 DIAGNOSIS — J302 Other seasonal allergic rhinitis: Secondary | ICD-10-CM

## 2022-12-17 DIAGNOSIS — L299 Pruritus, unspecified: Secondary | ICD-10-CM | POA: Diagnosis not present

## 2022-12-17 DIAGNOSIS — L501 Idiopathic urticaria: Secondary | ICD-10-CM

## 2022-12-17 DIAGNOSIS — J3089 Other allergic rhinitis: Secondary | ICD-10-CM | POA: Diagnosis not present

## 2022-12-17 DIAGNOSIS — J4550 Severe persistent asthma, uncomplicated: Secondary | ICD-10-CM | POA: Diagnosis not present

## 2022-12-17 MED ORDER — ALBUTEROL SULFATE HFA 108 (90 BASE) MCG/ACT IN AERS: 2.0000 | INHALATION_SPRAY | Freq: Four times a day (QID) | RESPIRATORY_TRACT | 1 refills | Status: DC | PRN

## 2022-12-17 NOTE — Telephone Encounter (Signed)
Stacy Swanson wants to schedule ger [name] but states she has been off of it for a while.  She said one was delivered to [facility].  I wasn't sure if she was still active or if you needed to do anything before she was scheduled.

## 2022-12-23 NOTE — Telephone Encounter (Signed)
Called patient and she advised they had delivered her Tezspire Pen to [facility]. I advised her she can pick same up in [place] and admin her inj at home

## 2023-02-17 ENCOUNTER — Other Ambulatory Visit: Payer: Self-pay | Admitting: Advanced Practice Midwife

## 2023-02-24 ENCOUNTER — Other Ambulatory Visit: Payer: Self-pay | Admitting: *Deleted

## 2023-02-24 MED ORDER — SLYND 4 MG PO TABS: 1.0000 | ORAL_TABLET | Freq: Every day | ORAL | 1 refills | Status: DC

## 2023-02-24 NOTE — Progress Notes (Signed)
Spoke to patient.  States she has new insurance.  Slynd script sent to [facility].  1 sample pack given to patient with discount card.

## 2023-03-18 ENCOUNTER — Encounter: Payer: Self-pay | Admitting: Family Medicine

## 2023-03-18 ENCOUNTER — Ambulatory Visit: Payer: 59 | Admitting: Family Medicine

## 2023-03-18 ENCOUNTER — Other Ambulatory Visit: Payer: Self-pay | Admitting: Adult Health

## 2023-03-18 ENCOUNTER — Other Ambulatory Visit: Payer: Self-pay

## 2023-03-18 VITALS — BP 138/72 | HR 77 | Temp 97.8°F | Resp 20 | Wt 225.1 lb

## 2023-03-18 DIAGNOSIS — J3089 Other allergic rhinitis: Secondary | ICD-10-CM | POA: Diagnosis not present

## 2023-03-18 DIAGNOSIS — L501 Idiopathic urticaria: Secondary | ICD-10-CM | POA: Diagnosis not present

## 2023-03-18 DIAGNOSIS — J4550 Severe persistent asthma, uncomplicated: Secondary | ICD-10-CM | POA: Diagnosis not present

## 2023-03-18 DIAGNOSIS — L299 Pruritus, unspecified: Secondary | ICD-10-CM | POA: Diagnosis not present

## 2023-03-18 DIAGNOSIS — L2084 Intrinsic (allergic) eczema: Secondary | ICD-10-CM

## 2023-03-18 DIAGNOSIS — J302 Other seasonal allergic rhinitis: Secondary | ICD-10-CM

## 2023-03-18 MED ORDER — TRIAMCINOLONE ACETONIDE 0.1 % EX OINT: TOPICAL_OINTMENT | CUTANEOUS | 0 refills | Status: DC

## 2023-03-18 MED ORDER — FAMOTIDINE 20 MG PO TABS: 20.0000 mg | ORAL_TABLET | Freq: Every day | ORAL | 5 refills | Status: DC

## 2023-03-18 MED ORDER — FEXOFENADINE HCL 180 MG PO TABS: 180.0000 mg | ORAL_TABLET | Freq: Two times a day (BID) | ORAL | 5 refills | Status: DC

## 2023-03-18 MED ORDER — SLYND 4 MG PO TABS: 1.0000 | ORAL_TABLET | Freq: Every day | ORAL | 3 refills | Status: DC

## 2023-03-18 NOTE — Patient Instructions (Addendum)
Asthma Continue Breztri 2 puffs twice a day with a spacer to prevent cough or wheeze Continue albuterol 2 puffs once every 4 hours as needed for cough or wheeze You may use albuterol 2 puffs 5 to 15 minutes before activity to decrease cough or wheeze Continue Tezspire injections once every 28 days. Make an appointment to get your injection.   Allergic rhinitis Continue allergen avoidance measures directed toward grass pollen, weed pollen, ragweed pollen, mold, and dust mite as listed below Continue Allegra 180 mg once a day as needed for runny nose or itch Continue Flonase Sensamist once a day as needed for stuffy nose Continue azelastine 1 to 2 sprays in each nostril twice a day as needed for a runny nose Begin saline nasal rinses as needed for nasal symptoms. Use this before any medicated nasal sprays for best result Consider allergen immunotherapy if your symptoms are not well-controlled with the treatment plan as listed above  Atopic dermatitis Continue a twice a day moisturizing routine Begin triamcinolone 0.1% ointment to red, itchy areas below your face up to twice a day. Do not use this medication longer than 2 weeks in a row  Hives (urticaria) Take the least amount of medications while remaining hive free Fexofenadine (Allegra) 180 mg twice a day and famotidine (Pepcid) 20 mg twice a day. If no symptoms for 7-14 days then decrease to. Fexofenadine (Allegra) 180 mg twice a day and famotidine (Pepcid) 20 mg once a day.  If no symptoms for 7-14 days then decrease to. Fexofenadine (Allegra) 180 mg twice a day.  If no symptoms for 7-14 days then decrease to. Fexofenadine (Allegra) 180 mg once a day.  May use Benadryl (diphenhydramine) as needed for breakthrough hives       If symptoms return, then step up dosage Keep a detailed symptom journal including foods eaten, contact with allergens, medications taken, weather changes.    Reflux Continue dietary and lifestyle modifications as  listed below Begin famotidine 20 mg once a day if needed for reflux control  Call the clinic if this treatment plan is not working well for you.  Follow up in 6 months or sooner if needed.  Reducing Pollen Exposure The American Academy of Allergy, Asthma and Immunology suggests the following steps to reduce your exposure to pollen during allergy seasons. Do not hang sheets or clothing out to dry; pollen may collect on these items. Do not mow lawns or spend time around freshly cut grass; mowing stirs up pollen. Keep windows closed at night.  Keep car windows closed while driving. Minimize morning activities outdoors, a time when pollen counts are usually at their highest. Stay indoors as much as possible when pollen counts or humidity is high and on windy days when pollen tends to remain in the air longer. Use air conditioning when possible.  Many air conditioners have filters that trap the pollen spores. Use a HEPA room air filter to remove pollen form the indoor air you breathe.  Control of Mold Allergen Mold and fungi can grow on a variety of surfaces provided certain temperature and moisture conditions exist.  Outdoor molds grow on plants, decaying vegetation and soil.  The major outdoor mold, Alternaria and Cladosporium, are found in very high numbers during hot and dry conditions.  Generally, a late Summer - Fall peak is seen for common outdoor fungal spores.  Rain will temporarily lower outdoor mold spore count, but counts rise rapidly when the rainy period ends.  The most important indoor  molds are Aspergillus and Penicillium.  Dark, humid and poorly ventilated basements are ideal sites for mold growth.  The next most common sites of mold growth are the bathroom and the kitchen.  Outdoor [employer] Use air conditioning and keep windows closed Avoid exposure to decaying vegetation. Avoid leaf raking. Avoid grain handling. Consider wearing a face mask if working in moldy  areas.  Indoor Mold Control Maintain humidity below 50%. Clean washable surfaces with 5% bleach solution. Remove sources e.g. Contaminated carpets.   Control of Dust Mite Allergen Dust mites play a major role in allergic asthma and rhinitis. They occur in environments with high humidity wherever human skin is found. Dust mites absorb humidity from the atmosphere (ie, they do not drink) and feed on organic matter (including shed human and animal skin). Dust mites are a microscopic type of insect that you cannot see with the naked eye. High levels of dust mites have been detected from mattresses, pillows, carpets, upholstered furniture, bed covers, clothes, soft toys and any woven material. The principal allergen of the dust mite is found in its feces. A gram of dust may contain 1,000 mites and 250,000 fecal particles. Mite antigen is easily measured in the air during house cleaning activities. Dust mites do not bite and do not cause harm to humans, other than by triggering allergies/asthma.  Ways to decrease your exposure to dust mites in your home:  1. Encase mattresses, box springs and pillows with a mite-impermeable barrier or cover  2. Wash sheets, blankets and drapes weekly in hot water (130 F) with detergent and dry them in a dryer on the hot setting.  3. Have the room cleaned frequently with a vacuum cleaner and a damp dust-mop. For carpeting or rugs, vacuuming with a vacuum cleaner equipped with a high-efficiency particulate air (HEPA) filter. The dust mite allergic individual should not be in a room which is being cleaned and should wait 1 hour after cleaning before going into the room.  4. Do not sleep on upholstered furniture (eg, couches).  5. If possible removing carpeting, upholstered furniture and drapery from the home is ideal. Horizontal blinds should be eliminated in the rooms where the person spends the most time (bedroom, study, television room). Washable vinyl, roller-type  shades are optimal.  6. Remove all non-washable stuffed toys from the bedroom. Wash stuffed toys weekly like sheets and blankets above.  7. Reduce indoor humidity to less than 50%. Inexpensive humidity monitors can be purchased at most hardware stores. Do not use a humidifier as can make the problem worse and are not recommended.

## 2023-03-18 NOTE — Progress Notes (Signed)
Slynd sent to [facility]

## 2023-03-18 NOTE — Progress Notes (Signed)
[address] [place] [address] Dept: [phone]  FOLLOW UP NOTE  Patient ID: Stacy Swanson, female    DOB: 1981-05-09  Age: 42 y.o. MRN: [id number] Date of Office Visit: 03/18/2023  Assessment  Chief Complaint: Allergic Rhinitis  (Itchy, drainage, slightly watery eyes, has been having to sometimes take up to 4 allegra. ) and Asthma (Has been having to use her inhaler bc of her allergies. Has been wheezing and trouble walking across the room. )  HPI Stacy Swanson is a 42 year old female who presents to the clinic for follow-up visit.  She was last seen in this clinic on 12/17/2022 by Thermon Leyland, FNP, for evaluation of asthma, allergic rhinitis, urticaria and pruritus.   At today's visit, she reports her asthma has been moderately well controlled with symptoms including shortness of breath with activity and wheeze and cough occurring in the morning.  She continues Breztri 2 puffs twice a day with a spacer and uses albuterol more frequently when her allergies are active.  She continues tezspire once every 28 days with no large or local reactions.  She reports a significant decrease in her symptoms of asthma while continuing tezspire injections.    Allergic rhinitis is reported as moderately well-controlled with symptoms including occasional clear rhinorrhea, nasal congestion, sneezing, and postnasal drainage.  She continues [employer] daily.  Her last environmental allergy testing was on 10/18/2021 and was positive to grass pollen, weed pollen, ragweed pollen, mold, and dust mite.  She denies any urticarial outbreaks since her last visit to this clinic, however, she does report that she frequently experiences pruritus.  She reports that she experiences more pruritus after she has been outside when pollen levels are elevated. She continues Allegra between 2-4 times a day and is not currently using famotidine.   At today's visit, she does report a red, dry,  itchy area on her right forearm that is mildly itchy. She reports that this area comes and goes and she also occasionally experiences a similar area on her left arm. She is currently using a moisturizing routine with moderate relief of symptoms. She has not tried a steroid cream previously.   Her current medications are listed in the chart.    Drug Allergies:  Allergies  Allergen Reactions   Codeine Other (See Comments)    Gave patient an ulcer.    Lactose Intolerance (Gi) Other (See Comments)    G.I. Upset.     Physical Exam: BP 138/72   Pulse 77   Temp 97.8 F (36.6 C)   Resp 20   Wt 225 lb 2 oz (102.1 kg)   SpO2 100%   BMI 36.34 kg/m    Physical Exam Vitals reviewed.  Constitutional:      Appearance: Normal appearance.  HENT:     Head: Normocephalic and atraumatic.     Right Ear: Tympanic membrane normal.     Left Ear: Tympanic membrane normal.     Nose:     Comments: Bilateral nares slightly edematous with clear nasal drainage noted.  Pharynx normal.  Ears normal.  Eyes normal.    Mouth/Throat:     Pharynx: Oropharynx is clear.  Eyes:     Conjunctiva/sclera: Conjunctivae normal.  Cardiovascular:     Rate and Rhythm: Normal rate and regular rhythm.     Heart sounds: Normal heart sounds. No murmur heard. Pulmonary:     Effort: Pulmonary effort is normal.     Breath sounds: Normal breath  sounds.     Comments: Lungs clear to auscultation Musculoskeletal:        General: Normal range of motion.     Cervical back: Normal range of motion and neck supple.  Skin:    General: Skin is warm and dry.     Comments: Eczematous area noted on her right forearm.  No open areas or drainage noted.  Neurological:     Mental Status: She is alert and oriented to person, place, and time.  Psychiatric:        Mood and Affect: Mood normal.        Behavior: Behavior normal.        Thought Content: Thought content normal.        Judgment: Judgment normal.     Diagnostics: FVC  3.73 which is 94% of predicted value, FEV1 2.96 which is 92% of predicted value.  Spirometry indicates normal ventilatory function.  Assessment and Plan: 1. Severe persistent asthma without complication   2. Seasonal and perennial allergic rhinitis   3. Idiopathic urticaria   4. Pruritus   5. Intrinsic atopic dermatitis     Meds ordered this encounter  Medications   triamcinolone ointment (KENALOG) 0.1 %    Sig: Apply to red, itchy areas below your face up to twice a day. Do not use this medication longer than 2 weeks in a row    Dispense:  453.6 g    Refill:  0   famotidine (PEPCID) 20 MG tablet    Sig: Take 1 tablet (20 mg total) by mouth daily.    Dispense:  30 tablet    Refill:  5   fexofenadine (ALLEGRA) 180 MG tablet    Sig: Take 1 tablet (180 mg total) by mouth in the morning and at bedtime.    Dispense:  60 tablet    Refill:  5    Patient Instructions  Asthma Continue Breztri 2 puffs twice a day with a spacer to prevent cough or wheeze Continue albuterol 2 puffs once every 4 hours as needed for cough or wheeze You may use albuterol 2 puffs 5 to 15 minutes before activity to decrease cough or wheeze Continue Tezspire injections once every 28 days. Make an appointment to get your injection.   Allergic rhinitis Continue allergen avoidance measures directed toward grass pollen, weed pollen, ragweed pollen, mold, and dust mite as listed below Continue Allegra 180 mg once a day as needed for runny nose or itch Continue Flonase Sensamist once a day as needed for stuffy nose Continue azelastine 1 to 2 sprays in each nostril twice a day as needed for a runny nose Begin saline nasal rinses as needed for nasal symptoms. Use this before any medicated nasal sprays for best result Consider allergen immunotherapy if your symptoms are not well-controlled with the treatment plan as listed above  Atopic dermatitis Continue a twice a day moisturizing routine Begin triamcinolone 0.1%  ointment to red, itchy areas below your face up to twice a day. Do not use this medication longer than 2 weeks in a row  Hives (urticaria) Take the least amount of medications while remaining hive free Fexofenadine (Allegra) 180 mg twice a day and famotidine (Pepcid) 20 mg twice a day. If no symptoms for 7-14 days then decrease to. Fexofenadine (Allegra) 180 mg twice a day and famotidine (Pepcid) 20 mg once a day.  If no symptoms for 7-14 days then decrease to. Fexofenadine (Allegra) 180 mg twice a day.  If no  symptoms for 7-14 days then decrease to. Fexofenadine (Allegra) 180 mg once a day.  May use Benadryl (diphenhydramine) as needed for breakthrough hives       If symptoms return, then step up dosage Keep a detailed symptom journal including foods eaten, contact with allergens, medications taken, weather changes.    Reflux Continue dietary and lifestyle modifications as listed below Begin famotidine 20 mg once a day if needed for reflux control  Call the clinic if this treatment plan is not working well for you.  Follow up in 6 months or sooner if needed.   Return in about 6 months (around 09/17/2023), or if symptoms worsen or fail to improve.    Thank you for the opportunity to care for this patient.  Please do not hesitate to contact me with questions.  Thermon Leyland, FNP [facility]

## 2023-03-24 ENCOUNTER — Encounter: Payer: Self-pay | Admitting: *Deleted

## 2023-07-02 ENCOUNTER — Other Ambulatory Visit (HOSPITAL_COMMUNITY): Payer: Self-pay

## 2023-09-16 ENCOUNTER — Ambulatory Visit: Payer: 59 | Admitting: Allergy & Immunology

## 2023-09-16 ENCOUNTER — Encounter: Payer: Self-pay | Admitting: Allergy & Immunology

## 2023-09-16 ENCOUNTER — Other Ambulatory Visit: Payer: Self-pay

## 2023-09-16 VITALS — BP 126/84 | HR 94 | Temp 98.1°F | Resp 18 | Ht 66.0 in | Wt 265.4 lb

## 2023-09-16 DIAGNOSIS — J3089 Other allergic rhinitis: Secondary | ICD-10-CM

## 2023-09-16 DIAGNOSIS — L501 Idiopathic urticaria: Secondary | ICD-10-CM

## 2023-09-16 DIAGNOSIS — L2084 Intrinsic (allergic) eczema: Secondary | ICD-10-CM | POA: Diagnosis not present

## 2023-09-16 DIAGNOSIS — J4550 Severe persistent asthma, uncomplicated: Secondary | ICD-10-CM

## 2023-09-16 DIAGNOSIS — J302 Other seasonal allergic rhinitis: Secondary | ICD-10-CM

## 2023-09-16 MED ORDER — BREZTRI AEROSPHERE 160-9-4.8 MCG/ACT IN AERO: 2.0000 | INHALATION_SPRAY | Freq: Two times a day (BID) | RESPIRATORY_TRACT | 5 refills | Status: DC

## 2023-09-16 MED ORDER — FEXOFENADINE HCL 180 MG PO TABS: 180.0000 mg | ORAL_TABLET | Freq: Two times a day (BID) | ORAL | 5 refills | Status: DC

## 2023-09-16 MED ORDER — ALBUTEROL SULFATE HFA 108 (90 BASE) MCG/ACT IN AERS
2.0000 | INHALATION_SPRAY | Freq: Four times a day (QID) | RESPIRATORY_TRACT | 1 refills | Status: DC | PRN
Start: 2023-09-16 — End: 2024-01-07

## 2023-09-16 NOTE — Patient Instructions (Addendum)
1. Moderate persistent asthma, uncomplicated - Lung testing not done today since you were feeling wheezy.  - It seems that your symptoms are pretty well controlled at this point with this regimen.  - We are not going to make any changes at this time.  - Daily controller medication(s): Breztri 2 puffs twice daily with spacer + Tezspire every 4 weeks  - Prior to physical activity: albuterol 2 puffs 10-15 minutes before physical activity. - Rescue medications: albuterol 4 puffs every 4-6 hours as needed - Asthma control goals:  * Full participation in all desired activities (may need albuterol before activity) * Albuterol use two time or less a week on average (not counting use with activity) * Cough interfering with sleep two time or less a month * Oral steroids no more than once a year * No hospitalizations  2. Seasonal and perennial allergic rhinitis - with overlying pruritis (itchiness) - Previous testing showed: grasses, ragweed, weeds, indoor molds, and dust mites. - Continue with: Allegra one or two tablets 1-2 times daily (off brand is fine) - You can use an extra dose of the antihistamine, if needed, for breakthrough symptoms.  - Consider nasal saline rinses 1-2 times daily to remove allergens from the nasal cavities as well as help with mucous clearance (this is especially helpful to do before the nasal sprays are given) - We are going to hold off on allergy shots as a means of long-term control.  3. Food intolerance (onion)  - Continue to avoid as much as you can.   4. GERD  - Continue with Pepcid (famotidine) 20mg  one to two times daily.   5. Urticaria - well controlled with the current treatment plan Take the least amount of medications while remaining hive free Fexofenadine (Allegra) 180 mg twice a day and famotidine (Pepcid) 20 mg twice a day. If no symptoms for 7-14 days then decrease to. Fexofenadine (Allegra) 180 mg twice a day and famotidine (Pepcid) 20 mg once a day.   If no symptoms for 7-14 days then decrease to. Fexofenadine (Allegra) 180 mg twice a day.  If no symptoms for 7-14 days then decrease to. Fexofenadine (Allegra) 180 mg once a day.   May use Benadryl (diphenhydramine) as needed for breakthrough hives       If symptoms return, then step up dosage Keep a detailed symptom journal including foods eaten, contact with allerge  6. Return in about 6 months (around 03/16/2024).    Please inform us of any Emergency Department visits, hospitalizations, or changes in symptoms. Call us before going to the ED for breathing or allergy symptoms since we might be able to fit you in for a sick visit. Feel free to contact us anytime with any questions, problems, or concerns.  It was a pleasure to meet you today!  Websites that have reliable patient information: 1. American Academy of Asthma, Allergy, and Immunology: www.aaaai.org 2. Food Allergy Research and Education (FARE): foodallergy.org 3. Mothers of Asthmatics: http://www.asthmacommunitynetwork.org 4. American College of Allergy, Asthma, and Immunology: www.acaai.org   COVID-19 Vaccine Information can be found at: [email or web address].[email or web address] For questions related to vaccine distribution or appointments, please email [email or web address] or call [phone].   We realize that you might be concerned about having an allergic reaction to the COVID19 vaccines. To help with that concern, WE ARE OFFERING THE COVID19 VACCINES IN OUR OFFICE! Ask the front desk for dates!     "Like" [place] on Facebook and Instagram for our latest updates!  A healthy democracy works best when [employer] participate! Make sure you are registered to vote! If you have moved or changed any of your contact information, you will need to get this updated before voting!  In some cases, you MAY be able to register to vote online:  [email or web address]

## 2023-09-16 NOTE — Progress Notes (Signed)
FOLLOW UP  Date of Service/Encounter:  09/16/23   Assessment:   Moderate persistent asthma, uncomplicated   Seasonal and perennial allergic rhinitis (grasses, ragweed, weeds, indoor molds, and dust mites)   Food intolerance  Multiple social stressors  Plan/Recommendations:   1. Moderate persistent asthma, uncomplicated - Lung testing not done today since you were feeling wheezy.  - It seems that your symptoms are pretty well controlled at this point with this regimen.  - We are not going to make any changes at this time.  - Daily controller medication(s): Breztri 2 puffs twice daily with spacer + Tezspire every 4 weeks  - Prior to physical activity: albuterol 2 puffs 10-15 minutes before physical activity. - Rescue medications: albuterol 4 puffs every 4-6 hours as needed - Asthma control goals:  * Full participation in all desired activities (may need albuterol before activity) * Albuterol use two time or less a week on average (not counting use with activity) * Cough interfering with sleep two time or less a month * Oral steroids no more than once a year * No hospitalizations  2. Seasonal and perennial allergic rhinitis - with overlying pruritis (itchiness) - Previous testing showed: grasses, ragweed, weeds, indoor molds, and dust mites. - Continue with: Allegra one or two tablets 1-2 times daily (off brand is fine) - You can use an extra dose of the antihistamine, if needed, for breakthrough symptoms.  - Consider nasal saline rinses 1-2 times daily to remove allergens from the nasal cavities as well as help with mucous clearance (this is especially helpful to do before the nasal sprays are given) - We are going to hold off on allergy shots as a means of long-term control.  3. Food intolerance (onion)  - Continue to avoid as much as you can.   4. GERD  - Continue with Pepcid (famotidine) 20mg  one to two times daily.   5. Urticaria - well controlled with the current  treatment plan Take the least amount of medications while remaining hive free Fexofenadine (Allegra) 180 mg twice a day and famotidine (Pepcid) 20 mg twice a day. If no symptoms for 7-14 days then decrease to. Fexofenadine (Allegra) 180 mg twice a day and famotidine (Pepcid) 20 mg once a day.  If no symptoms for 7-14 days then decrease to. Fexofenadine (Allegra) 180 mg twice a day.  If no symptoms for 7-14 days then decrease to. Fexofenadine (Allegra) 180 mg once a day.   May use Benadryl (diphenhydramine) as needed for breakthrough hives       If symptoms return, then step up dosage Keep a detailed symptom journal including foods eaten, contact with allerge  6. Return in about 6 months (around 03/16/2024).   Subjective:   Stacy Swanson is a 42 y.o. female presenting today for follow up of  Chief Complaint  Patient presents with   Asthma   Other    Husband has had a constant cough and now she is having issues with cough and wheeze     Stacy Swanson has a history of the following: Patient Active Problem List   Diagnosis Date Noted   Intrinsic atopic dermatitis 03/18/2023   Severe persistent asthma without complication 12/17/2022   Seasonal and perennial allergic rhinitis 12/17/2022   Idiopathic urticaria 12/17/2022   Pruritus 12/17/2022   Encounter for surveillance of contraceptive pills 12/09/2021   Encounter for gynecological examination with Papanicolaou smear of cervix 12/09/2021   Elevated BP without diagnosis of hypertension 09/20/2021  Contraception management 09/20/2021   IBS (irritable bowel syndrome) 12/07/2015   Cholelithiasis 07/11/2015    History obtained from: chart review and patient.  Discussed the use of AI scribe software for clinical note transcription with the patient and/or guardian, who gave verbal consent to proceed.  Stacy Swanson is a 42 y.o. female presenting for a follow up visit.  She was last seen in April 2024.  At that time, we will continue  with Breztri 2 puffs twice daily as well as albuterol as needed.  She was also continued on Tezspire injections every month.  For her allergic rhinitis, she was continued on Allegra as well as Flonase and Astelin.  Allergy shots were discussed.  Atopic dermatitis was not under good control.  She was started on triamcinolone 0.1% ointment twice a day as needed.  Her hives are under control with suppressive doses of antihistamines.  She started on famotidine 20 mg daily for her reflux.  Since the last visit, she has mostly done well.   Asthma/Respiratory Symptom History: The patient is currently on Tezspire and Breztri (two puffs twice a day) for a respiratory condition, reports a recent exacerbation of symptoms. She describes an increase in wheezing and coughing, particularly in the morning, which she attributes to skimping on her allergy medication (fexofenadine) due to her husband's increased need for it. The patient suspects that her current respiratory distress is partly due to allergies, but also believes there may be another underlying issue. The patient reports that her symptoms improve after taking her rescue inhaler, particularly on days when she has not taken enough allergy medication or has spent a significant amount of time outside. She also notes that visits to the vet trigger cold-like symptoms, even when she has taken her allergy medication.  She notes that about a week before her next Tezspire dose, she starts to feel a tightening in her chest, which eases after she takes the injection.   The patient's husband has also been experiencing respiratory issues, which she suspects may be due to mold exposure from their apartment's [facility] unit. The patient has since switched to a portable AC unit. The patient's husband is scheduled to see a pulmonologist next month.  Allergic Rhinitis Symptom History: The patient's environmental allergies are managed with fexofenadine, which she takes up to four times a  day, with additional Benadryl as needed. She reports that she often needs to take two doses in the morning and two at night.  Food Allergy Symptom History: The patient also mentions a sensitivity to onions, which cause gastric issues, but does not avoid them due to budget constraints. She also reports occasional reflux, which improves with movement and Pepto-Bismol  The patient also reports a recent injury to her wrist, the cause of which is unknown. She describes recurrent injury to the same area, most recently when pulling down her pants. She is also having a lot of problems with credit card debt. They had to incur a lot of debt to pay for her significant other's surgery, which apparently should have been covered by Workers Comp.   Otherwise, there have been no changes to her past medical history, surgical history, family history, or social history.    Review of systems otherwise negative other than that mentioned in the HPI.    Objective:   Blood pressure 126/84, pulse 94, temperature 98.1 F (36.7 C), resp. rate 18, height 5\' 6"  (1.676 m), weight 265 lb 6.4 oz (120.4 kg), SpO2 97%. Body mass index is 42.84  kg/m.    Physical Exam Vitals reviewed.  Constitutional:      Appearance: She is well-developed.     Comments: Very talkative which is normal for her.   HENT:     Head: Normocephalic and atraumatic.     Right Ear: Tympanic membrane, ear canal and external ear normal. No drainage, swelling or tenderness. Tympanic membrane is not injected, scarred, erythematous, retracted or bulging.     Left Ear: Tympanic membrane, ear canal and external ear normal. No drainage, swelling or tenderness. Tympanic membrane is not injected, scarred, erythematous, retracted or bulging.     Nose: No nasal deformity, septal deviation, mucosal edema or rhinorrhea.     Right Turbinates: Enlarged, swollen and pale.     Left Turbinates: Enlarged, swollen and pale.     Right Sinus: No maxillary sinus  tenderness or frontal sinus tenderness.     Left Sinus: No maxillary sinus tenderness or frontal sinus tenderness.     Mouth/Throat:     Lips: Pink.     Mouth: Mucous membranes are moist. Mucous membranes are not pale and not dry.     Pharynx: Uvula midline.     Comments: Minimal cobblestoning present.  Eyes:     General: Lids are normal. Allergic shiner present.        Right eye: No discharge.        Left eye: No discharge.     Conjunctiva/sclera: Conjunctivae normal.     Right eye: Right conjunctiva is not injected. No chemosis.    Left eye: Left conjunctiva is not injected. No chemosis.    Pupils: Pupils are equal, round, and reactive to light.  Cardiovascular:     Rate and Rhythm: Normal rate and regular rhythm.     Heart sounds: Normal heart sounds.  Pulmonary:     Effort: Pulmonary effort is normal. No tachypnea, accessory muscle usage or respiratory distress.     Breath sounds: Normal breath sounds. No wheezing, rhonchi or rales.     Comments: Moving air well in all lung fields. No increased work of breathing noted.  Chest:     Chest wall: No tenderness.  Abdominal:     Tenderness: There is no abdominal tenderness. There is no guarding or rebound.  Lymphadenopathy:     Head:     Right side of head: No submandibular, tonsillar or occipital adenopathy.     Left side of head: No submandibular, tonsillar or occipital adenopathy.     Cervical: No cervical adenopathy.  Skin:    Coloration: Skin is not pale.     Findings: No abrasion, erythema, petechiae or rash. Rash is not papular, urticarial or vesicular.  Neurological:     Mental Status: She is alert.  Psychiatric:        Behavior: Behavior is cooperative.      Diagnostic studies: none      Malachi Bonds, MD  [facility]

## 2023-10-19 ENCOUNTER — Other Ambulatory Visit: Payer: Self-pay | Admitting: *Deleted

## 2023-10-19 MED ORDER — TEZSPIRE 210 MG/1.91ML ~~LOC~~ SOAJ: 210.0000 mg | SUBCUTANEOUS | 11 refills | Status: DC

## 2023-10-23 ENCOUNTER — Other Ambulatory Visit: Payer: Self-pay | Admitting: *Deleted

## 2023-10-23 MED ORDER — TEZSPIRE 210 MG/1.91ML ~~LOC~~ SOAJ: 210.0000 mg | SUBCUTANEOUS | 11 refills | Status: DC

## 2024-01-07 ENCOUNTER — Other Ambulatory Visit: Payer: Self-pay | Admitting: Allergy & Immunology

## 2024-01-13 ENCOUNTER — Other Ambulatory Visit: Payer: Self-pay | Admitting: Internal Medicine

## 2024-01-13 DIAGNOSIS — Z1231 Encounter for screening mammogram for malignant neoplasm of breast: Secondary | ICD-10-CM

## 2024-01-27 ENCOUNTER — Ambulatory Visit
Admission: RE | Admit: 2024-01-27 | Discharge: 2024-01-27 | Disposition: A | Discharge status: Home / Self Care | Payer: 59 | Source: Ambulatory Visit | Attending: Internal Medicine | Admitting: Internal Medicine

## 2024-01-27 DIAGNOSIS — Z1231 Encounter for screening mammogram for malignant neoplasm of breast: Secondary | ICD-10-CM

## 2024-01-29 ENCOUNTER — Encounter: Payer: Self-pay | Admitting: Allergy & Immunology

## 2024-02-01 ENCOUNTER — Other Ambulatory Visit: Payer: Self-pay | Admitting: Internal Medicine

## 2024-02-01 DIAGNOSIS — R928 Other abnormal and inconclusive findings on diagnostic imaging of breast: Secondary | ICD-10-CM

## 2024-02-17 ENCOUNTER — Ambulatory Visit
Admission: RE | Admit: 2024-02-17 | Discharge: 2024-02-17 | Disposition: A | Discharge status: Home / Self Care | Source: Ambulatory Visit | Attending: Internal Medicine | Admitting: Internal Medicine

## 2024-02-17 DIAGNOSIS — R928 Other abnormal and inconclusive findings on diagnostic imaging of breast: Secondary | ICD-10-CM

## 2024-03-23 ENCOUNTER — Encounter: Payer: Self-pay | Admitting: Allergy & Immunology

## 2024-03-23 ENCOUNTER — Ambulatory Visit (INDEPENDENT_AMBULATORY_CARE_PROVIDER_SITE_OTHER): Payer: 59 | Admitting: Allergy & Immunology

## 2024-03-23 VITALS — BP 106/80 | HR 104 | Temp 98.4°F | Resp 20 | Wt 273.0 lb

## 2024-03-23 DIAGNOSIS — L501 Idiopathic urticaria: Secondary | ICD-10-CM | POA: Diagnosis not present

## 2024-03-23 DIAGNOSIS — J3089 Other allergic rhinitis: Secondary | ICD-10-CM

## 2024-03-23 DIAGNOSIS — J4550 Severe persistent asthma, uncomplicated: Secondary | ICD-10-CM

## 2024-03-23 DIAGNOSIS — J302 Other seasonal allergic rhinitis: Secondary | ICD-10-CM

## 2024-03-23 DIAGNOSIS — L2084 Intrinsic (allergic) eczema: Secondary | ICD-10-CM | POA: Diagnosis not present

## 2024-03-23 DIAGNOSIS — L299 Pruritus, unspecified: Secondary | ICD-10-CM

## 2024-03-23 MED ORDER — DOXEPIN HCL 50 MG PO CAPS: 50.0000 mg | ORAL_CAPSULE | Freq: Every day | ORAL | 1 refills | Status: DC

## 2024-03-23 MED ORDER — DUPILUMAB 300 MG/2ML ~~LOC~~ SOSY
300.0000 mg | PREFILLED_SYRINGE | SUBCUTANEOUS | Status: AC
  Administered 2024-03-23 – 2025-01-06 (×4): 300 mg via SUBCUTANEOUS

## 2024-03-23 MED ORDER — PREDNISONE 10 MG PO TABS: ORAL_TABLET | ORAL | 0 refills | Status: DC

## 2024-03-23 MED ORDER — BREZTRI AEROSPHERE 160-9-4.8 MCG/ACT IN AERO: 2.0000 | INHALATION_SPRAY | Freq: Two times a day (BID) | RESPIRATORY_TRACT | 5 refills | Status: DC

## 2024-03-23 NOTE — Patient Instructions (Addendum)
 1. Moderate persistent asthma, uncomplicated - Lung testing looked pretty normal today, which is good.  - Try to use your Breztri  regularly at least once a day.  - We are not going to make any changes at this time.  - Sample of Dupixent  provided today. - Tammy will reahc out to discuss the approval process.  - Daily controller medication(s): Breztri  2 puffs twice daily with spacer + Tezspire  every 4 weeks  - Prior to physical activity: albuterol  2 puffs 10-15 minutes before physical activity. - Rescue medications: albuterol  4 puffs every 4-6 hours as needed - Asthma control goals:  * Full participation in all desired activities (may need albuterol  before activity) * Albuterol  use two time or less a week on average (not counting use with activity) * Cough interfering with sleep two time or less a month * Oral steroids no more than once a year * No hospitalizations  2. Seasonal and perennial allergic rhinitis - with overlying pruritis (itchiness) - Previous testing showed: grasses, ragweed, weeds, indoor molds, and dust mites. - Continue with: antihistamines as you are doing - Add on Doxepin  50mg  at night to help with the itching.  - You can use an extra dose of the antihistamine, if needed, for breakthrough symptoms.  - Consider nasal saline rinses 1-2 times daily to remove allergens from the nasal cavities as well as help with mucous clearance (this is especially helpful to do before the nasal sprays are given) - We are going to hold off on allergy shots as a means of long-term control.  3. Food intolerance (onion)  - Continue to avoid as much as you can.   4. GERD  - Continue with Pepcid  (famotidine ) 20mg  one to two times daily.   5. Urticaria - well controlled with the current treatment plan - Start the prednisone  taper provided.  Take the least amount of medications while remaining hive free Fexofenadine  (Allegra ) 180 mg twice a day and famotidine  (Pepcid ) 20 mg twice a day. If no  symptoms for 7-14 days then decrease to. Fexofenadine  (Allegra ) 180 mg twice a day and famotidine  (Pepcid ) 20 mg once a day.  If no symptoms for 7-14 days then decrease to. Fexofenadine  (Allegra ) 180 mg twice a day.  If no symptoms for 7-14 days then decrease to. Fexofenadine  (Allegra ) 180 mg once a day.   May use Benadryl  (diphenhydramine ) as needed for breakthrough hives       If symptoms return, then step up dosage Keep a detailed symptom journal including foods eaten, contact with allergens.  6. Return in about 6 months (around 09/22/2024). You can have the follow up appointment with Dr. Idolina Maker or a Nurse Practicioner (our Nurse Practitioners are excellent and always have Physician oversight!).    Please inform us  of any Emergency Department visits, hospitalizations, or changes in symptoms. Call us  before going to the ED for breathing or allergy symptoms since we might be able to fit you in for a sick visit. Feel free to contact us  anytime with any questions, problems, or concerns.  It was a pleasure to see you again today!  Websites that have reliable patient information: 1. American Academy of Asthma, Allergy, and Immunology: www.aaaai.org 2. Food Allergy Research and Education (FARE): foodallergy.org 3. Mothers of Asthmatics: http://www.asthmacommunitynetwork.org 4. American College of Allergy, Asthma, and Immunology: www.acaai.org      "Like" us  on Facebook and Instagram for our latest updates!      A healthy democracy works best when [employer] participate! Make  sure you are registered to vote! If you have moved or changed any of your contact information, you will need to get this updated before voting! Scan the QR codes below to learn more!

## 2024-03-23 NOTE — Progress Notes (Signed)
 FOLLOW UP  Date of Service/Encounter:  03/23/24   Assessment:   Moderate persistent asthma, uncomplicated  Pruritus - changing to Dupixent  to help with the itching and asthma    Seasonal and perennial allergic rhinitis (grasses, ragweed, weeds, indoor molds, and dust mites)   Food intolerance   Multiple social stressors  Plan/Recommendations:   1. Moderate persistent asthma, uncomplicated - Lung testing looked pretty normal today, which is good.  - Try to use your Breztri  regularly at least once a day.  - We are not going to make any changes at this time.  - Sample of Dupixent  provided today. - Stacy Swanson will reahc out to discuss the approval process.  - Daily controller medication(s): Breztri  2 puffs twice daily with spacer + Tezspire  every 4 weeks  - Prior to physical activity: albuterol  2 puffs 10-15 minutes before physical activity. - Rescue medications: albuterol  4 puffs every 4-6 hours as needed - Asthma control goals:  * Full participation in all desired activities (may need albuterol  before activity) * Albuterol  use two time or less a week on average (not counting use with activity) * Cough interfering with sleep two time or less a month * Oral steroids no more than once a year * No hospitalizations  2. Seasonal and perennial allergic rhinitis - with overlying pruritis (itchiness) - Previous testing showed: grasses, ragweed, weeds, indoor molds, and dust mites. - Continue with: antihistamines as you are doing - Add on Doxepin  50mg  at night to help with the itching.  - You can use an extra dose of the antihistamine, if needed, for breakthrough symptoms.  - Consider nasal saline rinses 1-2 times daily to remove allergens from the nasal cavities as well as help with mucous clearance (this is especially helpful to do before the nasal sprays are given) - We are going to hold off on allergy shots as a means of long-term control.  3. Food intolerance (onion)  - Continue  to avoid as much as you can.   4. GERD  - Continue with Pepcid  (famotidine ) 20mg  one to two times daily.   5. Urticaria - well controlled with the current treatment plan - Start the prednisone  taper provided.  Take the least amount of medications while remaining hive free Fexofenadine  (Allegra ) 180 mg twice a day and famotidine  (Pepcid ) 20 mg twice a day. If no symptoms for 7-14 days then decrease to. Fexofenadine  (Allegra ) 180 mg twice a day and famotidine  (Pepcid ) 20 mg once a day.  If no symptoms for 7-14 days then decrease to. Fexofenadine  (Allegra ) 180 mg twice a day.  If no symptoms for 7-14 days then decrease to. Fexofenadine  (Allegra ) 180 mg once a day.   May use Benadryl  (diphenhydramine ) as needed for breakthrough hives       If symptoms return, then step up dosage Keep a detailed symptom journal including foods eaten, contact with allergens.  6. Return in about 6 months (around 09/22/2024). You can have the follow up appointment with Dr. Idolina Maker or a Nurse Practicioner (our Nurse Practitioners are excellent and always have Physician oversight!).    Subjective:   Stacy Swanson is a 43 y.o. female presenting today for follow up of  Chief Complaint  Patient presents with   Follow-up   Pruritus    Stacy Swanson has a history of the following: Patient Active Problem List   Diagnosis Date Noted   Intrinsic atopic dermatitis 03/18/2023   Severe persistent asthma without complication 12/17/2022   Seasonal  and perennial allergic rhinitis 12/17/2022   Idiopathic urticaria 12/17/2022   Pruritus 12/17/2022   Encounter for surveillance of contraceptive pills 12/09/2021   Encounter for gynecological examination with Papanicolaou smear of cervix 12/09/2021   Elevated BP without diagnosis of hypertension 09/20/2021   Contraception management 09/20/2021   IBS (irritable bowel syndrome) 12/07/2015   Cholelithiasis 07/11/2015    History obtained from: chart review and  patient.  Discussed the use of AI scribe software for clinical note transcription with the patient and/or guardian, who gave verbal consent to proceed.  Stacy Swanson is a 43 y.o. female presenting for a follow up visit.  She was last seen in October 2024.  At that time, we did not do lung testing since she was still wheezy.  We continue with Breztri  2 puffs twice daily and Tezspire  every month.  For her sinusitis, we continue with Allegra  1-2 times daily and nasal saline rinses.  She continue to avoid onion.  GERD was controlled with Pepcid  20 mg 1-2 times daily.  She was also continued on antihistamines for her hives, which she titrated at home.  Since last visit, she has mostly done well.  Asthma/Respiratory Symptom History: She has experienced increased wheezing, particularly noticeable in the week leading up to her scheduled asthma injection. She has not been using her inhalers as regularly as prescribed, taking Breztri  once in the morning. She temporarily lost her inhaler due to her kitten knocking it off her desk. She has not been using her Breztri  as consistently as she could be doing.  She has not been to the hospital nor she needed prednisone  for any breathing issues.  She denies any nighttime coughing or wheezing.  Allergic Rhinitis Symptom History: Her husband, who is allergic to cats, has been letting a stray cat into their home, which she believes may have contributed to the flea problem. She is planning to use diatomaceous earth and flea bombs to address the infestation. She has a history of allergies and has been taking Allegra , but it has not been effective against the current itching. She has not been using other antihistamines like Xyzal, Claritin, or Zyrtec recently.  Skin Symptom History: She is experiencing significant itching, which she attributes to a suspected flea infestation in her home, particularly in her bed and wheelchair. She has been using Benadryl , taking up to four doses a  day, but it does not completely alleviate the itching. She has previously used prednisone  for similar issues, which helped her recover faster.  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 They have been dealing with issues related to his insurance coverage due to a name change after marriage.   Otherwise, there have been no changes to her past medical history, surgical history, family history, or social history.    Review of systems otherwise negative other than that mentioned in the HPI.    Objective:   Blood pressure 106/80, pulse (!) 104, temperature 98.4 F (36.9 C), resp. rate 20, weight 273 lb (123.8 kg), SpO2 97%. Body mass index is 44.06 kg/m.    Physical Exam Vitals reviewed.  Constitutional:      Appearance: She is well-developed.     Comments: Very talkative which is normal for her. Good natured.   HENT:     Head: Normocephalic and atraumatic.     Right Ear: Tympanic membrane, ear canal and external ear normal. No drainage, swelling or tenderness. Tympanic membrane is not injected, scarred, erythematous, retracted or  bulging.     Left Ear: Tympanic membrane, ear canal and external ear normal. No drainage, swelling or tenderness. Tympanic membrane is not injected, scarred, erythematous, retracted or bulging.     Nose: Mucosal edema and rhinorrhea present. No nasal deformity or septal deviation.     Right Turbinates: Enlarged, swollen and pale.     Left Turbinates: Enlarged, swollen and pale.     Right Sinus: No maxillary sinus tenderness or frontal sinus tenderness.     Left Sinus: No maxillary sinus tenderness or frontal sinus tenderness.     Mouth/Throat:     Lips: Pink.     Mouth: Mucous membranes are moist. Mucous membranes are not pale and not dry.     Pharynx: Uvula midline.     Comments: Minimal cobblestoning present.  Eyes:     General: Lids are normal. Allergic shiner present.        Right eye: No discharge.         Left eye: No discharge.     Conjunctiva/sclera: Conjunctivae normal.     Right eye: Right conjunctiva is not injected. No chemosis.    Left eye: Left conjunctiva is not injected. No chemosis.    Pupils: Pupils are equal, round, and reactive to light.  Cardiovascular:     Rate and Rhythm: Normal rate and regular rhythm.     Heart sounds: Normal heart sounds.  Pulmonary:     Effort: Pulmonary effort is normal. No tachypnea, accessory muscle usage or respiratory distress.     Breath sounds: Normal breath sounds. No wheezing, rhonchi or rales.     Comments: Moving air well in all lung fields. No increased work of breathing noted.  Chest:     Chest wall: No tenderness.  Abdominal:     Tenderness: There is no abdominal tenderness. There is no guarding or rebound.  Lymphadenopathy:     Head:     Right side of head: No submandibular, tonsillar or occipital adenopathy.     Left side of head: No submandibular, tonsillar or occipital adenopathy.     Cervical: No cervical adenopathy.  Skin:    General: Skin is warm.     Capillary Refill: Capillary refill takes less than 2 seconds.     Coloration: Skin is not pale.     Findings: No abrasion, erythema, petechiae or rash. Rash is not papular, urticarial or vesicular.     Comments: Excorations in multiple places.   Neurological:     Mental Status: She is alert.  Psychiatric:        Behavior: Behavior is cooperative.      Diagnostic studies:    Spirometry: results normal (FEV1: 2.79/88%, FVC: 3.43/88%, FEV1/FVC: 81%).    Spirometry consistent with normal pattern.   Allergy Studies: none       Drexel Gentles, MD  [facility] 

## 2024-03-23 NOTE — Progress Notes (Signed)
 Immunotherapy   Patient Details  Name: Stacy Swanson MRN: [id number] Date of Birth: 31-Aug-1981  03/23/2024  Stacy Swanson started injections for  Dupixent  Following schedule: Every fourteen days.  Frequency:Every two weeks.  Epi-Pen:Not needed.  Consent signed today and patient instructions given. Patient waited in room two for fifteen minutes without an issue.    Brutus Caprice 03/23/2024, 12:11 PM

## 2024-03-28 ENCOUNTER — Encounter: Payer: Self-pay | Admitting: Family Medicine

## 2024-04-07 ENCOUNTER — Telehealth: Payer: Self-pay | Admitting: *Deleted

## 2024-04-07 NOTE — Telephone Encounter (Signed)
 Called patient and advised working on getting auth for dupixent  but Ins termed. She did get new Ins info today and will email to me to start process

## 2024-04-07 NOTE — Telephone Encounter (Signed)
-----   Message from [place] Chuck sent at 03/23/2024  2:19 PM EDT ----- Switching to Dupixent . Gave loading dose (600mg  - three 200mg  injections since that is all that we had)

## 2024-04-13 NOTE — Telephone Encounter (Signed)
 T/c to  patient she had emailed me new Ins. I have gotten approval, copay card and will submit to [facility],.Instructed on delivery, storage and dosing for same given

## 2024-04-13 NOTE — Telephone Encounter (Signed)
 Great thank you!

## 2024-05-04 ENCOUNTER — Ambulatory Visit

## 2024-05-04 DIAGNOSIS — J4550 Severe persistent asthma, uncomplicated: Secondary | ICD-10-CM | POA: Diagnosis not present

## 2024-05-04 NOTE — Progress Notes (Signed)
 Immunotherapy   Patient Details  Name: Stacy Swanson MRN: [id number] Date of Birth: 13-Mar-1981  05/04/2024  Stacy Swanson started injections for  Dupixent  self administration. Following schedule: Every fourteen days. Frequency:Every two weeks.  Epi-Pen:Not needed. Consent signed previously and patient instructions given on how to self administer in the thigh and abdomen. Patient was successfully taught how to se;f self administer in the abdomen and thigh. After being taught patient successfully self administered her Dupixent  in the left thigh.   Stacy Swanson 05/04/2024, 3:18 PM

## 2024-06-28 ENCOUNTER — Telehealth: Payer: Self-pay | Admitting: Gastroenterology

## 2024-06-28 NOTE — Telephone Encounter (Signed)
 Error

## 2024-08-10 ENCOUNTER — Other Ambulatory Visit: Payer: Self-pay | Admitting: Family Medicine

## 2024-08-12 ENCOUNTER — Ambulatory Visit

## 2024-08-18 ENCOUNTER — Telehealth: Payer: Self-pay | Admitting: *Deleted

## 2024-08-18 NOTE — Telephone Encounter (Signed)
-----   Message from Vick CHRISTELLA Pinal sent at 08/04/2024  1:27 PM EDT ----- Regarding: FW: PRIOR AUTH  ----- Message ----- From: Merilee Rikki LABOR, CMA Sent: 07/28/2024  11:44 AM EDT To: Rx Prior Auth Team Subject: PRIOR AUTH                                     Patient called in stating that she needs a prior auth done for her Dupixent  300mg /10ml  Quantity: 3  She states that a voicemail can be left if sh does not answer

## 2024-08-18 NOTE — Telephone Encounter (Signed)
 L/m for patient need a updated visit for response to Dupixent  o get reapproval

## 2024-09-05 NOTE — Telephone Encounter (Signed)
 Appt 10/22

## 2024-09-14 ENCOUNTER — Encounter: Payer: Self-pay | Admitting: Allergy & Immunology

## 2024-09-14 ENCOUNTER — Ambulatory Visit: Admitting: Allergy & Immunology

## 2024-09-14 VITALS — BP 98/70 | HR 119 | Temp 98.1°F | Resp 16 | Ht 66.0 in | Wt 278.8 lb

## 2024-09-14 DIAGNOSIS — L2084 Intrinsic (allergic) eczema: Secondary | ICD-10-CM

## 2024-09-14 DIAGNOSIS — J3089 Other allergic rhinitis: Secondary | ICD-10-CM | POA: Diagnosis not present

## 2024-09-14 DIAGNOSIS — J4550 Severe persistent asthma, uncomplicated: Secondary | ICD-10-CM | POA: Diagnosis not present

## 2024-09-14 DIAGNOSIS — L501 Idiopathic urticaria: Secondary | ICD-10-CM

## 2024-09-14 DIAGNOSIS — J302 Other seasonal allergic rhinitis: Secondary | ICD-10-CM

## 2024-09-14 MED ORDER — BREZTRI AEROSPHERE 160-9-4.8 MCG/ACT IN AERO: 2.0000 | INHALATION_SPRAY | Freq: Two times a day (BID) | RESPIRATORY_TRACT | 5 refills | Status: DC

## 2024-09-14 MED ORDER — DOXEPIN HCL 50 MG PO CAPS: 50.0000 mg | ORAL_CAPSULE | Freq: Every day | ORAL | 1 refills | Status: AC

## 2024-09-14 MED ORDER — FEXOFENADINE HCL 180 MG PO TABS: 180.0000 mg | ORAL_TABLET | Freq: Two times a day (BID) | ORAL | 1 refills | Status: DC

## 2024-09-14 MED ORDER — FAMOTIDINE 20 MG PO TABS: 20.0000 mg | ORAL_TABLET | Freq: Two times a day (BID) | ORAL | 1 refills | Status: DC

## 2024-09-14 NOTE — Patient Instructions (Addendum)
 1. Moderate persistent asthma, uncomplicated - Lung testing looked pretty normal today.  - We are definitely going to get that Dupixent  back on board.  - Daily controller medication(s): Breztri  2 puffs twice daily with spacer + Dupixent  every 2 weeks  - Prior to physical activity: albuterol  2 puffs 10-15 minutes before physical activity. - Rescue medications: albuterol  4 puffs every 4-6 hours as needed - Asthma control goals:  * Full participation in all desired activities (may need albuterol  before activity) * Albuterol  use two time or less a week on average (not counting use with activity) * Cough interfering with sleep two time or less a month * Oral steroids no more than once a year * No hospitalizations  2. Seasonal and perennial allergic rhinitis - with overlying pruritis (itchiness) - Previous testing showed: grasses, ragweed, weeds, indoor molds, and dust mites. - Continue with: antihistamines as you are doing - Continue with: doxepin  50mg  at night to help with the itching - You can use an extra dose of the antihistamine, if needed, for breakthrough symptoms.  - Consider nasal saline rinses 1-2 times daily to remove allergens from the nasal cavities as well as help with mucous clearance (this is especially helpful to do before the nasal sprays are given)  3. Food intolerance (onion)  - Continue to avoid as much as you can.   4. GERD  - Continue with Pepcid  (famotidine ) 20mg  one to two times daily.   5. Urticaria - well controlled with the current treatment plan - Start the prednisone  taper provided.  - Take the least amount of medications while remaining hive free Fexofenadine  (Allegra ) 180 mg twice a day and famotidine  (Pepcid ) 20 mg twice a day. If no symptoms for 7-14 days then decrease to. Fexofenadine  (Allegra ) 180 mg twice a day and famotidine  (Pepcid ) 20 mg once a day.  If no symptoms for 7-14 days then decrease to. Fexofenadine  (Allegra ) 180 mg twice a day.  If no  symptoms for 7-14 days then decrease to. Fexofenadine  (Allegra ) 180 mg once a day.   - May use Benadryl  (diphenhydramine ) as needed for breakthrough hives - Keep a detailed symptom journal including foods eaten, contact with allergens.  6. Return in about 3 months (around 12/15/2024). You can have the follow up appointment with Dr. Iva or a Nurse Practicioner (our Nurse Practitioners are excellent and always have Physician oversight!).    Please inform us  of any Emergency Department visits, hospitalizations, or changes in symptoms. Call us  before going to the ED for breathing or allergy symptoms since we might be able to fit you in for a sick visit. Feel free to contact us  anytime with any questions, problems, or concerns.  It was a pleasure to see you again today!  Websites that have reliable patient information: 1. American Academy of Asthma, Allergy, and Immunology: www.aaaai.org 2. Food Allergy Research and Education (FARE): foodallergy.org 3. Mothers of Asthmatics: http://www.asthmacommunitynetwork.org 4. American College of Allergy, Asthma, and Immunology: www.acaai.org      "Like" us  on Facebook and Instagram for our latest updates!      A healthy democracy works best when [employer] participate! Make sure you are registered to vote! If you have moved or changed any of your contact information, you will need to get this updated before voting! Scan the QR codes below to learn more!

## 2024-09-14 NOTE — Progress Notes (Signed)
 FOLLOW UP  Date of Service/Encounter:  09/14/24   Assessment:   Moderate persistent asthma, uncomplicated   Pruritus - changing to Dupixent  to help with the itching and asthma    Seasonal and perennial allergic rhinitis (grasses, ragweed, weeds, indoor molds, and dust mites)   Food intolerance   Multiple social stressors  History of recurrent AOM - but improved over the last 3-5 years   Plan/Recommendations:   1. Moderate persistent asthma, uncomplicated - Lung testing looked pretty normal today.  - We are definitely going to get that Dupixent  back on board.  - Daily controller medication(s): Breztri  2 puffs twice daily with spacer + Dupixent  every 2 weeks  - Prior to physical activity: albuterol  2 puffs 10-15 minutes before physical activity. - Rescue medications: albuterol  4 puffs every 4-6 hours as needed - Asthma control goals:  * Full participation in all desired activities (may need albuterol  before activity) * Albuterol  use two time or less a week on average (not counting use with activity) * Cough interfering with sleep two time or less a month * Oral steroids no more than once a year * No hospitalizations  2. Seasonal and perennial allergic rhinitis - with overlying pruritis (itchiness) - Previous testing showed: grasses, ragweed, weeds, indoor molds, and dust mites. - Continue with: antihistamines as you are doing - Continue with: doxepin  50mg  at night to help with the itching - You can use an extra dose of the antihistamine, if needed, for breakthrough symptoms.  - Consider nasal saline rinses 1-2 times daily to remove allergens from the nasal cavities as well as help with mucous clearance (this is especially helpful to do before the nasal sprays are given)  3. Food intolerance (onion)  - Continue to avoid as much as you can.   4. GERD  - Continue with Pepcid  (famotidine ) 20mg  one to two times daily.   5. Urticaria - well controlled with the current  treatment plan - Start the prednisone  taper provided.  - Take the least amount of medications while remaining hive free Fexofenadine  (Allegra ) 180 mg twice a day and famotidine  (Pepcid ) 20 mg twice a day. If no symptoms for 7-14 days then decrease to. Fexofenadine  (Allegra ) 180 mg twice a day and famotidine  (Pepcid ) 20 mg once a day.  If no symptoms for 7-14 days then decrease to. Fexofenadine  (Allegra ) 180 mg twice a day.  If no symptoms for 7-14 days then decrease to. Fexofenadine  (Allegra ) 180 mg once a day.   - May use Benadryl  (diphenhydramine ) as needed for breakthrough hives - Keep a detailed symptom journal including foods eaten, contact with allergens.  6. Return in about 3 months (around 12/15/2024). You can have the follow up appointment with Dr. Iva or a Nurse Practicioner (our Nurse Practitioners are excellent and always have Physician oversight!).   Subjective:   Stacy Swanson is a 43 y.o. female presenting today for follow up of  Chief Complaint  Patient presents with   Asthma    Expressed that she has had multiple flare ups. Using albuterol  on a daily basis.    Eczema    Says her insurance only covers her Dupixent  for a three month supply before wanting a new PA and for to be seen by a provider to ship out more medications. Expressed that she is having flare ups as well.   Allergic Rhinitis     Says she is having multiple flare ups.     Stacy Swanson has a  history of the following: Patient Active Problem List   Diagnosis Date Noted   Intrinsic atopic dermatitis 03/18/2023   Severe persistent asthma without complication (HCC) 12/17/2022   Seasonal and perennial allergic rhinitis 12/17/2022   Idiopathic urticaria 12/17/2022   Pruritus 12/17/2022   Encounter for surveillance of contraceptive pills 12/09/2021   Encounter for gynecological examination with Papanicolaou smear of cervix 12/09/2021   Elevated BP without diagnosis of hypertension 09/20/2021    Contraception management 09/20/2021   IBS (irritable bowel syndrome) 12/07/2015   Cholelithiasis 07/11/2015    History obtained from: chart review and patient.  Discussed the use of AI scribe software for clinical note transcription with the patient and/or guardian, who gave verbal consent to proceed.  Stacy Swanson is a 43 y.o. female presenting for a follow up visit.  She was last seen in April 2025.  At that time, she started Dupixent  injections for her asthma.  We continue with Breztri  2 puffs twice daily.  For her rhinitis, we continue with antihistamines and added on doxepin  50 mg at night.  She continue to avoid onion.  For her GERD, we will continue with Pepcid  1-2 times a day.  For her urticaria, we started her on a prednisone  taper and suppressive doses of antihistamines.  Since last visit, she has mostly done well.  Asthma/Respiratory Symptom History: She started Dupixent  in April and reported improvement in her asthma and hives. Due to insurance issues, she has been without Dupixent  for two months. She reports that her asthma symptoms have worsened since stopping the medication. She notes a significant decline in asthma control since stopping the medication, requiring almost daily use of her albuterol  inhaler, with four puffs needed instead of the usual two. She also uses Breztri  in the morning and sometimes at night, which she finds helpful when she remembers to take it. She experiences wheezing and shortness of breath, particularly on bad days, which occur every other day or more frequently. On these days, she finds herself wheezing all day and needing to use albuterol  multiple times. She describes good days as those where she does not experience significant breathing difficulties.  Allergic Rhinitis Symptom History: She remains on her antihistamines for her hives which seem to control her environmental allergies as well. She also remains on Doxepin  at night which is helping with her allergy  symptoms. She has thought about allergy shots but is not interested at this point in time.   Skin Symptom History: She has not experienced hives recently and manages her condition with Allegra  and Pepcid , adjusting based on her symptoms. She has not needed Benadryl  for a while and reports her last flare-up was a couple of months ago. Her sleep is aided by doxepin , which also helps with itching, although it causes some grogginess upon waking. Her work shift recently changed to the second shift, starting at 2 PM, which has affected her sleep schedule.  GERD Symptom History: She remains on the famotidine  1-2 times daily. This seems to be working well to control her symptoms.   Infection Symptom History: She has a history of ear infections as an adult but has not had any in recent years. She also mentions a past incident of a steam burn on her arm and scratches from her pets, which include six cats in her household.   She works from home. She is not exposed to anything that causes any flares of her symptoms.   Otherwise, there have been no changes to her past medical history,  surgical history, family history, or social history.    Review of systems otherwise negative other than that mentioned in the HPI.    Objective:   Blood pressure 98/70, pulse (!) 119, temperature 98.1 F (36.7 C), temperature source Temporal, resp. rate 16, height 5' 6 (1.676 m), weight 278 lb 12.8 oz (126.5 kg), SpO2 98%. Body mass index is 45 kg/m.    Physical Exam Vitals reviewed.  Constitutional:      Appearance: She is well-developed.     Comments: Very talkative which is normal for her. Good natured.   HENT:     Head: Normocephalic and atraumatic.     Right Ear: Tympanic membrane, ear canal and external ear normal. No drainage, swelling or tenderness. Tympanic membrane is not injected, scarred, erythematous, retracted or bulging.     Left Ear: Tympanic membrane, ear canal and external ear normal. No  drainage, swelling or tenderness. Tympanic membrane is not injected, scarred, erythematous, retracted or bulging.     Nose: Mucosal edema and rhinorrhea present. No nasal deformity or septal deviation.     Right Turbinates: Enlarged, swollen and pale.     Left Turbinates: Enlarged, swollen and pale.     Right Sinus: No maxillary sinus tenderness or frontal sinus tenderness.     Left Sinus: No maxillary sinus tenderness or frontal sinus tenderness.     Comments: No polyps noted.     Mouth/Throat:     Lips: Pink.     Mouth: Mucous membranes are moist. Mucous membranes are not pale and not dry.     Pharynx: Uvula midline.     Comments: Minimal cobblestoning present.  Eyes:     General: Lids are normal. Allergic shiner present.        Right eye: No discharge.        Left eye: No discharge.     Conjunctiva/sclera: Conjunctivae normal.     Right eye: Right conjunctiva is not injected. No chemosis.    Left eye: Left conjunctiva is not injected. No chemosis.    Pupils: Pupils are equal, round, and reactive to light.  Cardiovascular:     Rate and Rhythm: Normal rate and regular rhythm.     Heart sounds: Normal heart sounds.  Pulmonary:     Effort: Pulmonary effort is normal. No tachypnea, accessory muscle usage or respiratory distress.     Breath sounds: Normal breath sounds. No wheezing, rhonchi or rales.     Comments: Moving air well in all lung fields. No increased work of breathing noted.  Chest:     Chest wall: No tenderness.  Abdominal:     Tenderness: There is no abdominal tenderness. There is no guarding or rebound.  Lymphadenopathy:     Head:     Right side of head: No submandibular, tonsillar or occipital adenopathy.     Left side of head: No submandibular, tonsillar or occipital adenopathy.     Cervical: No cervical adenopathy.  Skin:    General: Skin is warm.     Capillary Refill: Capillary refill takes less than 2 seconds.     Coloration: Skin is not pale.     Findings: No  abrasion, erythema, petechiae or rash. Rash is not papular, urticarial or vesicular.     Comments: Excorations in multiple places.   Neurological:     Mental Status: She is alert.  Psychiatric:        Behavior: Behavior is cooperative.      Diagnostic studies:  Spirometry: results normal (FEV1: 2.76/87%, FVC: 3.45/88%, FEV1/FVC: 80%).    Spirometry consistent with normal pattern.   Allergy Studies: none       Marty Shaggy, MD  [facility] 

## 2024-09-15 ENCOUNTER — Telehealth: Payer: Self-pay | Admitting: *Deleted

## 2024-09-15 MED ORDER — DUPIXENT 300 MG/2ML ~~LOC~~ SOAJ: 600.0000 mg | Freq: Once | SUBCUTANEOUS | 11 refills | Status: AC

## 2024-09-15 NOTE — Telephone Encounter (Signed)
 Called patient and advised approval for dupixent  restart and rx to [facility] for same with loading dose. Explained dosing for restart and made sure she number to [facility]. She may have to reach out to them to order since she is inactive with last delivery in May

## 2024-09-15 NOTE — Telephone Encounter (Signed)
-----   Message from Marty Morton Shaggy sent at 09/14/2024 12:29 PM EDT ----- RESTART Dupi

## 2024-09-16 NOTE — Telephone Encounter (Signed)
 Thank you :)

## 2024-09-18 NOTE — Telephone Encounter (Signed)
 [facility] does not dispense biologics anymore since about 2 years ago sold their bios to [facility]. Based on her Ins she has to use [facility] since that is the specialty pharmacy designated by her Ins [place]

## 2024-09-21 ENCOUNTER — Ambulatory Visit: Admitting: Family Medicine

## 2024-12-16 ENCOUNTER — Ambulatory Visit: Admitting: Family Medicine

## 2024-12-16 ENCOUNTER — Encounter: Payer: Self-pay | Admitting: Family Medicine

## 2024-12-16 ENCOUNTER — Other Ambulatory Visit: Payer: Self-pay

## 2024-12-16 VITALS — BP 130/90 | HR 132 | Temp 97.6°F | Resp 18 | Ht 66.0 in | Wt 283.6 lb

## 2024-12-16 DIAGNOSIS — L501 Idiopathic urticaria: Secondary | ICD-10-CM

## 2024-12-16 DIAGNOSIS — J3089 Other allergic rhinitis: Secondary | ICD-10-CM

## 2024-12-16 DIAGNOSIS — J302 Other seasonal allergic rhinitis: Secondary | ICD-10-CM

## 2024-12-16 DIAGNOSIS — J4550 Severe persistent asthma, uncomplicated: Secondary | ICD-10-CM

## 2024-12-16 DIAGNOSIS — L299 Pruritus, unspecified: Secondary | ICD-10-CM

## 2024-12-16 MED ORDER — FEXOFENADINE HCL 180 MG PO TABS: 180.0000 mg | ORAL_TABLET | Freq: Two times a day (BID) | ORAL | 1 refills | Status: AC

## 2024-12-16 MED ORDER — BREZTRI AEROSPHERE 160-9-4.8 MCG/ACT IN AERO: 2.0000 | INHALATION_SPRAY | Freq: Two times a day (BID) | RESPIRATORY_TRACT | 5 refills | Status: AC

## 2024-12-16 MED ORDER — FAMOTIDINE 20 MG PO TABS: 20.0000 mg | ORAL_TABLET | Freq: Two times a day (BID) | ORAL | 1 refills | Status: AC

## 2024-12-16 MED ORDER — ALBUTEROL SULFATE HFA 108 (90 BASE) MCG/ACT IN AERS: 2.0000 | INHALATION_SPRAY | Freq: Four times a day (QID) | RESPIRATORY_TRACT | 1 refills | Status: AC | PRN

## 2024-12-16 NOTE — Patient Instructions (Addendum)
 Asthma Continue Breztri  2 puffs twice a day with a spacer to prevent cough or wheeze Continue albuterol  2 puffs once every 4 hours if needed for cough or wheeze You may use albuterol  2 puffs 5 to 15 minutes before activity to decrease cough or wheeze Change Dupixent  injections to get then in the clinic once every other week  Allergic rhinitis Continue allergen avoidance measures directed toward grass pollen, weed pollen, ragweed pollen, indoor mold, and dust mite as listed below Continue Allegra  once or twice a day if needed for runny nose or itch Continue Flonase  2 sprays in each nostril once a day if needed for stuffy nose Consider saline nasal rinses as needed for nasal symptoms. Use this before any medicated nasal sprays for best result Consider allergen immunotherapy if your symptoms are not well-controlled with the treatment plan as listed above  Hives (urticaria/pruritus Take the least amount of medications while remaining hive free Fexofenadine  180 mg twice a day and famotidine  (Pepcid ) 20 mg twice a day. If no symptoms for 7-14 days then decrease to Fexofenadine  180 mg twice a day and famotidine  (Pepcid ) 20 mg once a day.  If no symptoms for 7-14 days then decrease to Fexofenadine  180 mg twice a day.  If no symptoms for 7-14 days then decrease to Fexofenadine  180 mg once a day.  May use Benadryl  (diphenhydramine ) as needed for breakthrough hives       If symptoms return, then step up dosage Continue doxepin  once a day Keep a detailed symptom journal including foods eaten, contact with allergens, medications taken, weather changes.    Food intolerance Continue to avoid onion is much as possible  Reflux Continue dietary and lifestyle modifications as listed below Continue famotidine  20 mg once or twice a day  Call the clinic if this treatment plan is not working well for you.  Follow up in 6 months or sooner if needed.  Reducing Pollen Exposure The American Academy of  Allergy, Asthma and Immunology suggests the following steps to reduce your exposure to pollen during allergy seasons. Do not hang sheets or clothing out to dry; pollen may collect on these items. Do not mow lawns or spend time around freshly cut grass; mowing stirs up pollen. Keep windows closed at night.  Keep car windows closed while driving. Minimize morning activities outdoors, a time when pollen counts are usually at their highest. Stay indoors as much as possible when pollen counts or humidity is high and on windy days when pollen tends to remain in the air longer. Use air conditioning when possible.  Many air conditioners have filters that trap the pollen spores. Use a HEPA room air filter to remove pollen form the indoor air you breathe.  Control of Mold Allergen Mold and fungi can grow on a variety of surfaces provided certain temperature and moisture conditions exist.  Outdoor molds grow on plants, decaying vegetation and soil.  The major outdoor mold, Alternaria and Cladosporium, are found in very high numbers during hot and dry conditions.  Generally, a late Summer - Fall peak is seen for common outdoor fungal spores.  Rain will temporarily lower outdoor mold spore count, but counts rise rapidly when the rainy period ends.  The most important indoor molds are Aspergillus and Penicillium.  Dark, humid and poorly ventilated basements are ideal sites for mold growth.  The next most common sites of mold growth are the bathroom and the kitchen.  Outdoor [employer] Use air conditioning and keep windows closed  Avoid exposure to decaying vegetation. Avoid leaf raking. Avoid grain handling. Consider wearing a face mask if working in moldy areas.  Indoor Mold Control Maintain humidity below 50%. Clean washable surfaces with 5% bleach solution. Remove sources e.g. Contaminated carpets.   Control of Dust Mite Allergen Dust mites play a major role in allergic asthma and rhinitis. They  occur in environments with high humidity wherever human skin is found. Dust mites absorb humidity from the atmosphere (ie, they do not drink) and feed on organic matter (including shed human and animal skin). Dust mites are a microscopic type of insect that you cannot see with the naked eye. High levels of dust mites have been detected from mattresses, pillows, carpets, upholstered furniture, bed covers, clothes, soft toys and any woven material. The principal allergen of the dust mite is found in its feces. A gram of dust may contain 1,000 mites and 250,000 fecal particles. Mite antigen is easily measured in the air during house cleaning activities. Dust mites do not bite and do not cause harm to humans, other than by triggering allergies/asthma.  Ways to decrease your exposure to dust mites in your home:  1. Encase mattresses, box springs and pillows with a mite-impermeable barrier or cover  2. Wash sheets, blankets and drapes weekly in hot water (130 F) with detergent and dry them in a dryer on the hot setting.  3. Have the room cleaned frequently with a vacuum cleaner and a damp dust-mop. For carpeting or rugs, vacuuming with a vacuum cleaner equipped with a high-efficiency particulate air (HEPA) filter. The dust mite allergic individual should not be in a room which is being cleaned and should wait 1 hour after cleaning before going into the room.  4. Do not sleep on upholstered furniture (eg, couches).  5. If possible removing carpeting, upholstered furniture and drapery from the home is ideal. Horizontal blinds should be eliminated in the rooms where the person spends the most time (bedroom, study, television room). Washable vinyl, roller-type shades are optimal.  6. Remove all non-washable stuffed toys from the bedroom. Wash stuffed toys weekly like sheets and blankets above.  7. Reduce indoor humidity to less than 50%. Inexpensive humidity monitors can be purchased at most hardware stores.  Do not use a humidifier as can make the problem worse and are not recommended.

## 2024-12-16 NOTE — Progress Notes (Signed)
 "  [address] [place] [place] [address] Dept: [phone]  FOLLOW UP NOTE  Patient ID: Stacy Swanson, female    DOB: 12-Mar-1981  Age: 43 y.o. MRN: [id number] Date of Office Visit: 12/16/2024  Assessment  Chief Complaint: Follow-up (Pt presents to the office 3 month follow up. Pt states she is getting over the flu. Reports that she is having a hard time giving herself the dupixent  injection. )  HPI Stacy Swanson   Discussed the use of AI scribe software for clinical note transcription with the patient, who gave verbal consent to proceed.  History of Present Illness Stacy Swanson is a 44 year old female who presents with exacerbation of allergy symptoms following a recent flu illness.  She reported that her recent flu illness began on January 1st with changes in taste, followed by congestion and coughing on January 2nd. No fever was noted during this illness. Her symptoms included increased shortness of breath, wheezing, and a crackling sensation in her chest. She has been using albuterol  two to three times daily during the first week of her illness and has tried to minimize its use in the past week unless symptoms are severe.   She continues to use Breztri  2 puffs twice a day and takes Dupixent  injections every other week, although she has missed some doses due to anxiety and difficulty administering the injections herself. Her husband assists with the injections when possible, but she finds it painful and challenging to administer them correctly. She has experienced cramping and a burning sensation in her leg, which she associates with a back issue.  She is interested in changing to in-clinic administration of Dupixent  at this time.  Her allergy symptoms, including sneezing and runny nose, have been more pronounced with the flu, but she typically manages them with antihistamines, taking Allegra  180 mg daily. She shares her medication with her husband, who is also  experiencing allergy symptoms due to exposure to cats. She takes famotidine  once daily and reports that her symptoms of itching are significantly better with antihistamines. She has not experienced hives recently.  Reflux is well-controlled with no symptoms including heartburn or vomiting. She continues to avoid sources of onion when possible.  She reports several social stressors.  She manages a busy schedule, working overtime and handling household responsibilities, which sometimes leads to forgetting her medication. She experiences reflux or heartburn if she eats late at night.  Her current medications are listed in the chart.   Drug Allergies:  Allergies[1]  Physical Exam: BP (!) 130/90 (BP Location: Left Arm, Patient Position: Sitting, Cuff Size: Large)   Pulse (!) 132   Temp 97.6 F (36.4 C) (Temporal)   Resp 18   Ht 5' 6 (1.676 m)   Wt 283 lb 9.6 oz (128.6 kg)   SpO2 97%   BMI 45.77 kg/m    Physical Exam Vitals reviewed.  Constitutional:      Appearance: Normal appearance.  HENT:     Head: Normocephalic and atraumatic.     Right Ear: Tympanic membrane normal.     Left Ear: Tympanic membrane normal.     Nose:     Comments: Bilateral nares slightly erythmeatous with thin clear nasal drainage. Ears normal. Eyes normal. Pharynx normal.    Mouth/Throat:     Pharynx: Oropharynx is clear.  Eyes:     Conjunctiva/sclera: Conjunctivae normal.  Cardiovascular:     Rate and Rhythm: Normal rate and regular rhythm.  Heart sounds: Normal heart sounds. No murmur heard. Pulmonary:     Effort: Pulmonary effort is normal.     Breath sounds: Normal breath sounds.     Comments: Lungs clear to auscultation Musculoskeletal:        General: Normal range of motion.     Cervical back: Normal range of motion and neck supple.  Skin:    General: Skin is warm and dry.  Neurological:     Mental Status: She is alert and oriented to person, place, and time.  Psychiatric:        Mood  and Affect: Mood normal.        Behavior: Behavior normal.        Thought Content: Thought content normal.        Judgment: Judgment normal.     Diagnostics: FVC 3.01 which is 77% of predicted value, FEV1 2.40 which is 75% of predicted value.  Spirometry indicates normal ventilatory function.  Assessment and Plan: 1. Severe persistent asthma without complication (HCC)   2. Seasonal and perennial allergic rhinitis   3. Idiopathic urticaria   4. Pruritus     No orders of the defined types were placed in this encounter.   Patient Instructions  Asthma Continue Breztri  2 puffs twice a day with a spacer to prevent cough or wheeze Continue albuterol  2 puffs once every 4 hours if needed for cough or wheeze You may use albuterol  2 puffs 5 to 15 minutes before activity to decrease cough or wheeze Change Dupixent  injections to get then in the clinic once every other week  Allergic rhinitis Continue allergen avoidance measures directed toward grass pollen, weed pollen, ragweed pollen, indoor mold, and dust mite as listed below Continue Allegra  once or twice a day if needed for runny nose or itch Continue Flonase  2 sprays in each nostril once a day if needed for stuffy nose Consider saline nasal rinses as needed for nasal symptoms. Use this before any medicated nasal sprays for best result Consider allergen immunotherapy if your symptoms are not well-controlled with the treatment plan as listed above  Hives (urticaria/pruritus Take the least amount of medications while remaining hive free Fexofenadine  180 mg twice a day and famotidine  (Pepcid ) 20 mg twice a day. If no symptoms for 7-14 days then decrease to Fexofenadine  180 mg twice a day and famotidine  (Pepcid ) 20 mg once a day.  If no symptoms for 7-14 days then decrease to Fexofenadine  180 mg twice a day.  If no symptoms for 7-14 days then decrease to Fexofenadine  180 mg once a day.  May use Benadryl  (diphenhydramine ) as needed for  breakthrough hives       If symptoms return, then step up dosage Continue doxepin  once a day Keep a detailed symptom journal including foods eaten, contact with allergens, medications taken, weather changes.    Food intolerance Continue to avoid onion is much as possible  Reflux Continue dietary and lifestyle modifications as listed below Continue famotidine  20 mg once or twice a day  Call the clinic if this treatment plan is not working well for you.  Follow up in 6 months or sooner if needed.   Return in about 6 months (around 06/15/2025), or if symptoms worsen or fail to improve.    Thank you for the opportunity to care for this patient.  Please do not hesitate to contact me with questions.  Arlean Mutter, FNP [facility]          [  1]  Allergies Allergen Reactions   Codeine Other (See Comments)    Gave patient an ulcer.    Lactose Intolerance (Gi) Other (See Comments)    G.I. Upset.    "

## 2024-12-23 ENCOUNTER — Ambulatory Visit (INDEPENDENT_AMBULATORY_CARE_PROVIDER_SITE_OTHER)

## 2024-12-23 DIAGNOSIS — J4550 Severe persistent asthma, uncomplicated: Secondary | ICD-10-CM

## 2025-01-06 ENCOUNTER — Ambulatory Visit

## 2025-01-06 DIAGNOSIS — J4550 Severe persistent asthma, uncomplicated: Secondary | ICD-10-CM

## 2025-01-20 ENCOUNTER — Ambulatory Visit

## 2025-06-16 ENCOUNTER — Ambulatory Visit: Admitting: Family Medicine
